# Patient Record
Sex: Female | Born: 1945 | Race: White | Hispanic: No | Marital: Married | Smoking: Former smoker
Health system: Southern US, Community
[De-identification: ages and names within clinical notes are randomized; demographics above are authoritative.]

## PROBLEM LIST (undated history)

## (undated) DIAGNOSIS — C801 Malignant (primary) neoplasm, unspecified: Secondary | ICD-10-CM

## (undated) DIAGNOSIS — H9190 Unspecified hearing loss, unspecified ear: Secondary | ICD-10-CM

## (undated) DIAGNOSIS — I509 Heart failure, unspecified: Secondary | ICD-10-CM

## (undated) DIAGNOSIS — I10 Essential (primary) hypertension: Secondary | ICD-10-CM

## (undated) HISTORY — DX: Essential (primary) hypertension: I10

## (undated) HISTORY — DX: Malignant (primary) neoplasm, unspecified: C80.1

## (undated) HISTORY — PX: BREAST SURGERY: SHX581

## (undated) HISTORY — DX: Unspecified hearing loss, unspecified ear: H91.90

---

## 1997-06-04 ENCOUNTER — Other Ambulatory Visit: Admission: RE | Admit: 1997-06-04 | Discharge: 1997-06-04 | Payer: Self-pay | Admitting: General Surgery

## 1997-06-11 ENCOUNTER — Ambulatory Visit (HOSPITAL_COMMUNITY): Admission: RE | Admit: 1997-06-11 | Discharge: 1997-06-11 | Payer: Self-pay | Admitting: General Surgery

## 1997-06-23 ENCOUNTER — Ambulatory Visit (HOSPITAL_COMMUNITY): Admission: RE | Admit: 1997-06-23 | Discharge: 1997-06-23 | Payer: Self-pay | Admitting: General Surgery

## 1997-09-30 ENCOUNTER — Encounter: Admission: RE | Admit: 1997-09-30 | Discharge: 1997-12-29 | Payer: Self-pay | Admitting: Radiation Oncology

## 1998-05-13 ENCOUNTER — Other Ambulatory Visit: Admission: RE | Admit: 1998-05-13 | Discharge: 1998-05-13 | Payer: Self-pay | Admitting: Obstetrics & Gynecology

## 1999-07-09 ENCOUNTER — Other Ambulatory Visit: Admission: RE | Admit: 1999-07-09 | Discharge: 1999-07-09 | Payer: Self-pay | Admitting: Obstetrics & Gynecology

## 2000-10-30 ENCOUNTER — Other Ambulatory Visit: Admission: RE | Admit: 2000-10-30 | Discharge: 2000-10-30 | Payer: Self-pay | Admitting: Obstetrics & Gynecology

## 2001-11-16 ENCOUNTER — Other Ambulatory Visit: Admission: RE | Admit: 2001-11-16 | Discharge: 2001-11-16 | Payer: Self-pay | Admitting: Obstetrics & Gynecology

## 2003-03-31 ENCOUNTER — Other Ambulatory Visit: Admission: RE | Admit: 2003-03-31 | Discharge: 2003-03-31 | Payer: Self-pay | Admitting: Obstetrics & Gynecology

## 2008-04-07 ENCOUNTER — Ambulatory Visit (HOSPITAL_COMMUNITY): Admission: RE | Admit: 2008-04-07 | Discharge: 2008-04-08 | Payer: Self-pay | Admitting: General Surgery

## 2008-04-07 ENCOUNTER — Encounter (INDEPENDENT_AMBULATORY_CARE_PROVIDER_SITE_OTHER): Payer: Self-pay | Admitting: General Surgery

## 2008-04-08 ENCOUNTER — Ambulatory Visit: Payer: Self-pay | Admitting: Oncology

## 2008-04-23 ENCOUNTER — Ambulatory Visit (HOSPITAL_COMMUNITY): Admission: RE | Admit: 2008-04-23 | Discharge: 2008-04-23 | Payer: Self-pay | Admitting: Oncology

## 2008-04-23 LAB — CBC WITH DIFFERENTIAL/PLATELET
Basophils Absolute: 0 10*3/uL (ref 0.0–0.1)
Eosinophils Absolute: 0.1 10*3/uL (ref 0.0–0.5)
HGB: 13.9 g/dL (ref 11.6–15.9)
NEUT#: 4 10*3/uL (ref 1.5–6.5)
RBC: 4.18 10*6/uL (ref 3.70–5.45)
RDW: 13.7 % (ref 11.2–14.5)
WBC: 6.6 10*3/uL (ref 3.9–10.3)
lymph#: 1.9 10*3/uL (ref 0.9–3.3)

## 2008-04-23 LAB — COMPREHENSIVE METABOLIC PANEL
ALT: 36 U/L — ABNORMAL HIGH (ref 0–35)
Albumin: 3.3 g/dL — ABNORMAL LOW (ref 3.5–5.2)
Alkaline Phosphatase: 72 U/L (ref 39–117)
CO2: 27 mEq/L (ref 19–32)
Glucose, Bld: 105 mg/dL — ABNORMAL HIGH (ref 70–99)
Potassium: 4 mEq/L (ref 3.5–5.3)
Sodium: 140 mEq/L (ref 135–145)
Total Protein: 6 g/dL (ref 6.0–8.3)

## 2008-04-23 LAB — CANCER ANTIGEN 27.29: CA 27.29: 12 U/mL (ref 0–39)

## 2008-05-21 ENCOUNTER — Ambulatory Visit (HOSPITAL_BASED_OUTPATIENT_CLINIC_OR_DEPARTMENT_OTHER): Admission: RE | Admit: 2008-05-21 | Discharge: 2008-05-21 | Payer: Self-pay | Admitting: General Surgery

## 2008-05-30 ENCOUNTER — Ambulatory Visit: Payer: Self-pay | Admitting: Oncology

## 2008-06-03 LAB — CBC WITH DIFFERENTIAL/PLATELET
Basophils Absolute: 0 10*3/uL (ref 0.0–0.1)
Eosinophils Absolute: 0.1 10*3/uL (ref 0.0–0.5)
HGB: 14.2 g/dL (ref 11.6–15.9)
LYMPH%: 26.1 % (ref 14.0–49.7)
MCV: 98.9 fL (ref 79.5–101.0)
MONO#: 0.6 10*3/uL (ref 0.1–0.9)
MONO%: 7.7 % (ref 0.0–14.0)
NEUT#: 4.9 10*3/uL (ref 1.5–6.5)
Platelets: 245 10*3/uL (ref 145–400)
RBC: 4.28 10*6/uL (ref 3.70–5.45)
WBC: 7.5 10*3/uL (ref 3.9–10.3)

## 2008-06-03 LAB — COMPREHENSIVE METABOLIC PANEL
Albumin: 3.8 g/dL (ref 3.5–5.2)
Alkaline Phosphatase: 78 U/L (ref 39–117)
BUN: 12 mg/dL (ref 6–23)
CO2: 30 mEq/L (ref 19–32)
Glucose, Bld: 106 mg/dL — ABNORMAL HIGH (ref 70–99)
Potassium: 4.2 mEq/L (ref 3.5–5.3)
Sodium: 141 mEq/L (ref 135–145)
Total Protein: 6.6 g/dL (ref 6.0–8.3)

## 2008-06-03 LAB — PROLACTIN: Prolactin: 12.2 ng/mL

## 2008-06-11 ENCOUNTER — Ambulatory Visit (HOSPITAL_COMMUNITY): Admission: RE | Admit: 2008-06-11 | Discharge: 2008-06-11 | Payer: Self-pay | Admitting: Oncology

## 2008-06-11 LAB — CBC WITH DIFFERENTIAL/PLATELET
BASO%: 0.5 % (ref 0.0–2.0)
Eosinophils Absolute: 0.1 10*3/uL (ref 0.0–0.5)
MCHC: 34 g/dL (ref 31.5–36.0)
MONO#: 0.7 10*3/uL (ref 0.1–0.9)
NEUT#: 7 10*3/uL — ABNORMAL HIGH (ref 1.5–6.5)
RBC: 4.26 10*6/uL (ref 3.70–5.45)
RDW: 13 % (ref 11.2–14.5)
WBC: 9.5 10*3/uL (ref 3.9–10.3)
lymph#: 1.8 10*3/uL (ref 0.9–3.3)

## 2008-06-11 LAB — COMPREHENSIVE METABOLIC PANEL
Albumin: 3.5 g/dL (ref 3.5–5.2)
BUN: 12 mg/dL (ref 6–23)
CO2: 30 mEq/L (ref 19–32)
Calcium: 9.1 mg/dL (ref 8.4–10.5)
Chloride: 101 mEq/L (ref 96–112)
Glucose, Bld: 104 mg/dL — ABNORMAL HIGH (ref 70–99)
Potassium: 3.9 mEq/L (ref 3.5–5.3)

## 2008-06-12 ENCOUNTER — Observation Stay (HOSPITAL_COMMUNITY): Admission: AD | Admit: 2008-06-12 | Discharge: 2008-06-13 | Payer: Self-pay | Admitting: Oncology

## 2008-06-12 ENCOUNTER — Ambulatory Visit: Payer: Self-pay | Admitting: Oncology

## 2008-06-18 LAB — CBC WITH DIFFERENTIAL/PLATELET
Basophils Absolute: 0 10*3/uL (ref 0.0–0.1)
EOS%: 1.6 % (ref 0.0–7.0)
Eosinophils Absolute: 0.1 10*3/uL (ref 0.0–0.5)
HGB: 13.3 g/dL (ref 11.6–15.9)
LYMPH%: 22.1 % (ref 14.0–49.7)
MCH: 33.5 pg (ref 25.1–34.0)
MCV: 99 fL (ref 79.5–101.0)
MONO%: 8 % (ref 0.0–14.0)
NEUT#: 4.1 10*3/uL (ref 1.5–6.5)
Platelets: 234 10*3/uL (ref 145–400)
RBC: 3.96 10*6/uL (ref 3.70–5.45)
RDW: 13.2 % (ref 11.2–14.5)

## 2008-06-18 LAB — COMPREHENSIVE METABOLIC PANEL
AST: 31 U/L (ref 0–37)
Alkaline Phosphatase: 59 U/L (ref 39–117)
BUN: 13 mg/dL (ref 6–23)
Glucose, Bld: 131 mg/dL — ABNORMAL HIGH (ref 70–99)
Total Bilirubin: 0.5 mg/dL (ref 0.3–1.2)

## 2008-07-02 LAB — CBC WITH DIFFERENTIAL/PLATELET
Basophils Absolute: 0 10*3/uL (ref 0.0–0.1)
Eosinophils Absolute: 0 10*3/uL (ref 0.0–0.5)
HCT: 38.8 % (ref 34.8–46.6)
HGB: 13 g/dL (ref 11.6–15.9)
LYMPH%: 36.2 % (ref 14.0–49.7)
MCH: 33 pg (ref 25.1–34.0)
MCV: 98.4 fL (ref 79.5–101.0)
MONO%: 12.5 % (ref 0.0–14.0)
NEUT#: 2.6 10*3/uL (ref 1.5–6.5)
NEUT%: 50.2 % (ref 38.4–76.8)
Platelets: 174 10*3/uL (ref 145–400)

## 2008-07-02 LAB — COMPREHENSIVE METABOLIC PANEL
AST: 24 U/L (ref 0–37)
Albumin: 3.5 g/dL (ref 3.5–5.2)
BUN: 12 mg/dL (ref 6–23)
CO2: 29 mEq/L (ref 19–32)
Calcium: 8.9 mg/dL (ref 8.4–10.5)
Chloride: 104 mEq/L (ref 96–112)
Creatinine, Ser: 0.5 mg/dL (ref 0.40–1.20)
Glucose, Bld: 108 mg/dL — ABNORMAL HIGH (ref 70–99)
Potassium: 3.7 mEq/L (ref 3.5–5.3)

## 2008-07-07 ENCOUNTER — Ambulatory Visit: Payer: Self-pay | Admitting: Oncology

## 2008-07-09 ENCOUNTER — Encounter: Admission: RE | Admit: 2008-07-09 | Discharge: 2008-08-21 | Payer: Self-pay | Admitting: Oncology

## 2008-07-09 LAB — CBC WITH DIFFERENTIAL/PLATELET
Basophils Absolute: 0 10*3/uL (ref 0.0–0.1)
Eosinophils Absolute: 0 10*3/uL (ref 0.0–0.5)
HCT: 38.4 % (ref 34.8–46.6)
HGB: 13.1 g/dL (ref 11.6–15.9)
MONO#: 0.4 10*3/uL (ref 0.1–0.9)
NEUT#: 2.6 10*3/uL (ref 1.5–6.5)
NEUT%: 51.1 % (ref 38.4–76.8)
RDW: 12.7 % (ref 11.2–14.5)
WBC: 5.2 10*3/uL (ref 3.9–10.3)
lymph#: 2.1 10*3/uL (ref 0.9–3.3)

## 2008-07-16 LAB — CBC WITH DIFFERENTIAL/PLATELET
BASO%: 0.3 % (ref 0.0–2.0)
EOS%: 0.5 % (ref 0.0–7.0)
LYMPH%: 52.4 % — ABNORMAL HIGH (ref 14.0–49.7)
MCH: 33.1 pg (ref 25.1–34.0)
MCHC: 34.1 g/dL (ref 31.5–36.0)
MCV: 97 fL (ref 79.5–101.0)
MONO%: 6.3 % (ref 0.0–14.0)
NEUT#: 1.5 10*3/uL (ref 1.5–6.5)
Platelets: 118 10*3/uL — ABNORMAL LOW (ref 145–400)
RBC: 4.04 10*6/uL (ref 3.70–5.45)
RDW: 13.4 % (ref 11.2–14.5)

## 2008-07-16 LAB — COMPREHENSIVE METABOLIC PANEL
AST: 26 U/L (ref 0–37)
Albumin: 3.5 g/dL (ref 3.5–5.2)
Alkaline Phosphatase: 57 U/L (ref 39–117)
Potassium: 4 mEq/L (ref 3.5–5.3)
Sodium: 139 mEq/L (ref 135–145)
Total Bilirubin: 0.9 mg/dL (ref 0.3–1.2)
Total Protein: 6.1 g/dL (ref 6.0–8.3)

## 2008-07-30 LAB — CBC WITH DIFFERENTIAL/PLATELET
Basophils Absolute: 0 10*3/uL (ref 0.0–0.1)
EOS%: 0.1 % (ref 0.0–7.0)
Eosinophils Absolute: 0 10*3/uL (ref 0.0–0.5)
LYMPH%: 42.8 % (ref 14.0–49.7)
MCH: 33.9 pg (ref 25.1–34.0)
MCV: 97.7 fL (ref 79.5–101.0)
MONO%: 10 % (ref 0.0–14.0)
NEUT#: 1.8 10*3/uL (ref 1.5–6.5)
Platelets: 171 10*3/uL (ref 145–400)
RBC: 3.59 10*6/uL — ABNORMAL LOW (ref 3.70–5.45)
RDW: 15 % — ABNORMAL HIGH (ref 11.2–14.5)

## 2008-07-30 LAB — COMPREHENSIVE METABOLIC PANEL
AST: 27 U/L (ref 0–37)
Albumin: 3.7 g/dL (ref 3.5–5.2)
Alkaline Phosphatase: 54 U/L (ref 39–117)
BUN: 9 mg/dL (ref 6–23)
Glucose, Bld: 115 mg/dL — ABNORMAL HIGH (ref 70–99)
Potassium: 3.7 mEq/L (ref 3.5–5.3)
Sodium: 139 mEq/L (ref 135–145)
Total Bilirubin: 0.8 mg/dL (ref 0.3–1.2)

## 2008-08-06 LAB — CBC WITH DIFFERENTIAL/PLATELET
Basophils Absolute: 0 10*3/uL (ref 0.0–0.1)
Eosinophils Absolute: 0 10*3/uL (ref 0.0–0.5)
HCT: 35.3 % (ref 34.8–46.6)
HGB: 12.1 g/dL (ref 11.6–15.9)
MONO#: 0.5 10*3/uL (ref 0.1–0.9)
NEUT#: 3.5 10*3/uL (ref 1.5–6.5)
NEUT%: 56.1 % (ref 38.4–76.8)
RDW: 15.5 % — ABNORMAL HIGH (ref 11.2–14.5)
WBC: 6.3 10*3/uL (ref 3.9–10.3)
lymph#: 2.2 10*3/uL (ref 0.9–3.3)

## 2008-08-12 ENCOUNTER — Ambulatory Visit: Payer: Self-pay | Admitting: Oncology

## 2008-08-13 LAB — CBC WITH DIFFERENTIAL/PLATELET
Basophils Absolute: 0 10*3/uL (ref 0.0–0.1)
EOS%: 0.2 % (ref 0.0–7.0)
Eosinophils Absolute: 0 10*3/uL (ref 0.0–0.5)
HCT: 33 % — ABNORMAL LOW (ref 34.8–46.6)
HGB: 11.4 g/dL — ABNORMAL LOW (ref 11.6–15.9)
MCH: 34.4 pg — ABNORMAL HIGH (ref 25.1–34.0)
MCV: 99.4 fL (ref 79.5–101.0)
MONO%: 9.4 % (ref 0.0–14.0)
NEUT#: 2.5 10*3/uL (ref 1.5–6.5)
NEUT%: 50.7 % (ref 38.4–76.8)
RDW: 16.4 % — ABNORMAL HIGH (ref 11.2–14.5)

## 2008-08-27 LAB — COMPREHENSIVE METABOLIC PANEL
ALT: 18 U/L (ref 0–35)
Albumin: 3.5 g/dL (ref 3.5–5.2)
Alkaline Phosphatase: 54 U/L (ref 39–117)
Glucose, Bld: 112 mg/dL — ABNORMAL HIGH (ref 70–99)
Potassium: 3.8 mEq/L (ref 3.5–5.3)
Sodium: 137 mEq/L (ref 135–145)
Total Bilirubin: 0.8 mg/dL (ref 0.3–1.2)
Total Protein: 5.7 g/dL — ABNORMAL LOW (ref 6.0–8.3)

## 2008-08-27 LAB — CBC WITH DIFFERENTIAL/PLATELET
Basophils Absolute: 0 10*3/uL (ref 0.0–0.1)
EOS%: 0.2 % (ref 0.0–7.0)
Eosinophils Absolute: 0 10*3/uL (ref 0.0–0.5)
HGB: 10.8 g/dL — ABNORMAL LOW (ref 11.6–15.9)
MCH: 33.5 pg (ref 25.1–34.0)
NEUT#: 3.3 10*3/uL (ref 1.5–6.5)
RBC: 3.22 10*6/uL — ABNORMAL LOW (ref 3.70–5.45)
RDW: 18.7 % — ABNORMAL HIGH (ref 11.2–14.5)
lymph#: 1.9 10*3/uL (ref 0.9–3.3)
nRBC: 0 % (ref 0–0)

## 2008-09-03 LAB — CBC WITH DIFFERENTIAL/PLATELET
Eosinophils Absolute: 0 10*3/uL (ref 0.0–0.5)
HGB: 10.4 g/dL — ABNORMAL LOW (ref 11.6–15.9)
MCV: 100 fL (ref 79.5–101.0)
MONO%: 5.7 % (ref 0.0–14.0)
NEUT#: 2.3 10*3/uL (ref 1.5–6.5)
RBC: 3.06 10*6/uL — ABNORMAL LOW (ref 3.70–5.45)
RDW: 18.6 % — ABNORMAL HIGH (ref 11.2–14.5)
WBC: 4.4 10*3/uL (ref 3.9–10.3)
lymph#: 1.8 10*3/uL (ref 0.9–3.3)
nRBC: 0 % (ref 0–0)

## 2008-09-05 ENCOUNTER — Ambulatory Visit: Payer: Self-pay | Admitting: Oncology

## 2008-09-10 LAB — CBC WITH DIFFERENTIAL/PLATELET
Basophils Absolute: 0 10*3/uL (ref 0.0–0.1)
EOS%: 0.3 % (ref 0.0–7.0)
HCT: 29.3 % — ABNORMAL LOW (ref 34.8–46.6)
LYMPH%: 41.8 % (ref 14.0–49.7)
MCH: 37.2 pg — ABNORMAL HIGH (ref 25.1–34.0)
MONO%: 9.7 % (ref 0.0–14.0)
NEUT#: 1.8 10*3/uL (ref 1.5–6.5)
RDW: 23.1 % — ABNORMAL HIGH (ref 11.2–14.5)
WBC: 3.7 10*3/uL — ABNORMAL LOW (ref 3.9–10.3)

## 2008-09-25 LAB — CBC WITH DIFFERENTIAL/PLATELET
Basophils Absolute: 0 10*3/uL (ref 0.0–0.1)
EOS%: 0.2 % (ref 0.0–7.0)
Eosinophils Absolute: 0 10*3/uL (ref 0.0–0.5)
HCT: 29.3 % — ABNORMAL LOW (ref 34.8–46.6)
HGB: 10 g/dL — ABNORMAL LOW (ref 11.6–15.9)
MCH: 38.4 pg — ABNORMAL HIGH (ref 25.1–34.0)
MONO#: 0.5 10*3/uL (ref 0.1–0.9)
NEUT#: 1.1 10*3/uL — ABNORMAL LOW (ref 1.5–6.5)
NEUT%: 32.5 % — ABNORMAL LOW (ref 38.4–76.8)
RDW: 22 % — ABNORMAL HIGH (ref 11.2–14.5)
lymph#: 1.8 10*3/uL (ref 0.9–3.3)

## 2008-09-25 LAB — COMPREHENSIVE METABOLIC PANEL
CO2: 26 mEq/L (ref 19–32)
Calcium: 9.1 mg/dL (ref 8.4–10.5)
Creatinine, Ser: 0.63 mg/dL (ref 0.40–1.20)
Glucose, Bld: 87 mg/dL (ref 70–99)
Total Bilirubin: 0.5 mg/dL (ref 0.3–1.2)

## 2008-09-26 LAB — CANCER ANTIGEN 27.29: CA 27.29: 26 U/mL (ref 0–39)

## 2008-09-26 LAB — VITAMIN D 25 HYDROXY (VIT D DEFICIENCY, FRACTURES): Vit D, 25-Hydroxy: 40 ng/mL (ref 30–89)

## 2008-10-11 ENCOUNTER — Other Ambulatory Visit: Payer: Self-pay | Admitting: Emergency Medicine

## 2008-10-11 ENCOUNTER — Inpatient Hospital Stay (HOSPITAL_COMMUNITY): Admission: EM | Admit: 2008-10-11 | Discharge: 2008-10-13 | Payer: Self-pay | Admitting: Psychiatry

## 2008-10-11 ENCOUNTER — Ambulatory Visit: Payer: Self-pay | Admitting: Psychiatry

## 2008-10-15 ENCOUNTER — Other Ambulatory Visit (HOSPITAL_COMMUNITY): Admission: RE | Admit: 2008-10-15 | Discharge: 2008-11-19 | Payer: Self-pay | Admitting: Psychiatry

## 2008-10-16 ENCOUNTER — Ambulatory Visit: Payer: Self-pay | Admitting: Psychiatry

## 2008-10-24 ENCOUNTER — Ambulatory Visit: Payer: Self-pay | Admitting: Oncology

## 2008-10-28 LAB — CBC WITH DIFFERENTIAL/PLATELET
Basophils Absolute: 0 10*3/uL (ref 0.0–0.1)
EOS%: 0.2 % (ref 0.0–7.0)
Eosinophils Absolute: 0 10*3/uL (ref 0.0–0.5)
HCT: 38.4 % (ref 34.8–46.6)
HGB: 13.1 g/dL (ref 11.6–15.9)
MCH: 36.7 pg — ABNORMAL HIGH (ref 25.1–34.0)
MCV: 107.6 fL — ABNORMAL HIGH (ref 79.5–101.0)
MONO%: 6.5 % (ref 0.0–14.0)
NEUT#: 4 10*3/uL (ref 1.5–6.5)
NEUT%: 67.5 % (ref 38.4–76.8)
lymph#: 1.5 10*3/uL (ref 0.9–3.3)

## 2008-10-28 LAB — COMPREHENSIVE METABOLIC PANEL
AST: 24 U/L (ref 0–37)
Albumin: 3.8 g/dL (ref 3.5–5.2)
BUN: 7 mg/dL (ref 6–23)
Calcium: 9.8 mg/dL (ref 8.4–10.5)
Chloride: 105 mEq/L (ref 96–112)
Creatinine, Ser: 0.71 mg/dL (ref 0.40–1.20)
Glucose, Bld: 108 mg/dL — ABNORMAL HIGH (ref 70–99)

## 2008-12-16 ENCOUNTER — Ambulatory Visit (HOSPITAL_BASED_OUTPATIENT_CLINIC_OR_DEPARTMENT_OTHER): Admission: RE | Admit: 2008-12-16 | Discharge: 2008-12-16 | Payer: Self-pay | Admitting: General Surgery

## 2008-12-18 ENCOUNTER — Ambulatory Visit: Payer: Self-pay | Admitting: Oncology

## 2008-12-22 LAB — CBC WITH DIFFERENTIAL/PLATELET
Basophils Absolute: 0 10*3/uL (ref 0.0–0.1)
EOS%: 0.2 % (ref 0.0–7.0)
HCT: 38.8 % (ref 34.8–46.6)
HGB: 13.1 g/dL (ref 11.6–15.9)
LYMPH%: 31.7 % (ref 14.0–49.7)
MCH: 34.8 pg — ABNORMAL HIGH (ref 25.1–34.0)
MCV: 103.3 fL — ABNORMAL HIGH (ref 79.5–101.0)
MONO%: 7.4 % (ref 0.0–14.0)
NEUT%: 60.3 % (ref 38.4–76.8)
Platelets: 211 10*3/uL (ref 145–400)
RDW: 14.1 % (ref 11.2–14.5)

## 2008-12-22 LAB — COMPREHENSIVE METABOLIC PANEL
AST: 17 U/L (ref 0–37)
Alkaline Phosphatase: 65 U/L (ref 39–117)
BUN: 15 mg/dL (ref 6–23)
Creatinine, Ser: 0.64 mg/dL (ref 0.40–1.20)
Total Bilirubin: 0.8 mg/dL (ref 0.3–1.2)

## 2008-12-30 ENCOUNTER — Ambulatory Visit: Admission: RE | Admit: 2008-12-30 | Discharge: 2009-02-13 | Payer: Self-pay | Admitting: Radiation Oncology

## 2009-02-16 ENCOUNTER — Ambulatory Visit: Admission: RE | Admit: 2009-02-16 | Discharge: 2009-03-06 | Payer: Self-pay | Admitting: Radiation Oncology

## 2009-03-19 ENCOUNTER — Ambulatory Visit: Admission: RE | Admit: 2009-03-19 | Discharge: 2009-03-19 | Payer: Self-pay | Admitting: Radiation Oncology

## 2009-03-30 ENCOUNTER — Ambulatory Visit: Payer: Self-pay | Admitting: Oncology

## 2009-04-01 LAB — CBC WITH DIFFERENTIAL/PLATELET
Eosinophils Absolute: 0.1 10*3/uL (ref 0.0–0.5)
HCT: 41.2 % (ref 34.8–46.6)
LYMPH%: 27.3 % (ref 14.0–49.7)
MCHC: 33.9 g/dL (ref 31.5–36.0)
MCV: 96.2 fL (ref 79.5–101.0)
MONO%: 7.2 % (ref 0.0–14.0)
NEUT%: 62.2 % (ref 38.4–76.8)
Platelets: 179 10*3/uL (ref 145–400)
RBC: 4.28 10*6/uL (ref 3.70–5.45)

## 2009-04-01 LAB — COMPREHENSIVE METABOLIC PANEL
Alkaline Phosphatase: 62 U/L (ref 39–117)
Creatinine, Ser: 0.7 mg/dL (ref 0.40–1.20)
Glucose, Bld: 82 mg/dL (ref 70–99)
Sodium: 141 mEq/L (ref 135–145)
Total Bilirubin: 0.5 mg/dL (ref 0.3–1.2)
Total Protein: 6.5 g/dL (ref 6.0–8.3)

## 2009-04-01 LAB — CANCER ANTIGEN 27.29: CA 27.29: 16 U/mL (ref 0–39)

## 2009-09-29 ENCOUNTER — Ambulatory Visit: Payer: Self-pay | Admitting: Oncology

## 2009-09-29 LAB — COMPREHENSIVE METABOLIC PANEL
Albumin: 4 g/dL (ref 3.5–5.2)
CO2: 27 mEq/L (ref 19–32)
Glucose, Bld: 103 mg/dL — ABNORMAL HIGH (ref 70–99)
Sodium: 142 mEq/L (ref 135–145)
Total Bilirubin: 0.3 mg/dL (ref 0.3–1.2)
Total Protein: 6.3 g/dL (ref 6.0–8.3)

## 2009-09-29 LAB — CBC WITH DIFFERENTIAL/PLATELET
Eosinophils Absolute: 0.1 10*3/uL (ref 0.0–0.5)
HCT: 38 % (ref 34.8–46.6)
LYMPH%: 18.9 % (ref 14.0–49.7)
MONO#: 0.5 10*3/uL (ref 0.1–0.9)
NEUT#: 4.6 10*3/uL (ref 1.5–6.5)
Platelets: 238 10*3/uL (ref 145–400)
RBC: 4.07 10*6/uL (ref 3.70–5.45)
WBC: 6.5 10*3/uL (ref 3.9–10.3)

## 2009-09-29 LAB — CANCER ANTIGEN 27.29: CA 27.29: 22 U/mL (ref 0–39)

## 2010-03-07 ENCOUNTER — Encounter: Payer: Self-pay | Admitting: Oncology

## 2010-04-06 ENCOUNTER — Other Ambulatory Visit: Payer: Self-pay | Admitting: Oncology

## 2010-04-06 ENCOUNTER — Encounter (HOSPITAL_BASED_OUTPATIENT_CLINIC_OR_DEPARTMENT_OTHER): Payer: Medicare Other | Admitting: Oncology

## 2010-04-06 DIAGNOSIS — C50419 Malignant neoplasm of upper-outer quadrant of unspecified female breast: Secondary | ICD-10-CM

## 2010-04-06 LAB — COMPREHENSIVE METABOLIC PANEL
ALT: 17 U/L (ref 0–35)
AST: 18 U/L (ref 0–37)
Albumin: 4.4 g/dL (ref 3.5–5.2)
Calcium: 9.8 mg/dL (ref 8.4–10.5)
Chloride: 105 mEq/L (ref 96–112)
Potassium: 4 mEq/L (ref 3.5–5.3)
Sodium: 143 mEq/L (ref 135–145)

## 2010-04-06 LAB — CBC WITH DIFFERENTIAL/PLATELET
BASO%: 0.7 % (ref 0.0–2.0)
HCT: 41 % (ref 34.8–46.6)
MCHC: 34.4 g/dL (ref 31.5–36.0)
MONO#: 0.3 10*3/uL (ref 0.1–0.9)
RBC: 4.31 10*6/uL (ref 3.70–5.45)
RDW: 13.9 % (ref 11.2–14.5)
WBC: 5.6 10*3/uL (ref 3.9–10.3)
lymph#: 1.4 10*3/uL (ref 0.9–3.3)
nRBC: 0 % (ref 0–0)

## 2010-04-13 ENCOUNTER — Encounter (HOSPITAL_BASED_OUTPATIENT_CLINIC_OR_DEPARTMENT_OTHER): Payer: Medicare Other | Admitting: Oncology

## 2010-04-13 DIAGNOSIS — C50419 Malignant neoplasm of upper-outer quadrant of unspecified female breast: Secondary | ICD-10-CM

## 2010-05-21 LAB — URINE DRUGS OF ABUSE SCREEN W ALC, ROUTINE (REF LAB)
Benzodiazepines.: POSITIVE — AB
Cocaine Metabolites: NEGATIVE
Ethyl Alcohol: <10 mg/dL (ref <10)
Marijuana Metabolite: NEGATIVE
Opiate Screen, Urine: NEGATIVE
Phencyclidine (PCP): NEGATIVE
Propoxyphene: NEGATIVE

## 2010-05-21 LAB — BENZODIAZEPINE, QUANTITATIVE, URINE
Alprazolam (GC/LC/MS), ur confirm: NEGATIVE
Flurazepam GC/MS Conf: NEGATIVE
Nordiazepam GC/MS Conf: 160 ng/mL
Oxazepam GC/MS Conf: 1700 ng/mL

## 2010-05-22 LAB — ETHANOL: Alcohol, Ethyl (B): 13 mg/dL — ABNORMAL HIGH (ref 0–10)

## 2010-05-22 LAB — COMPREHENSIVE METABOLIC PANEL
ALT: 39 U/L — ABNORMAL HIGH (ref 0–35)
AST: 47 U/L — ABNORMAL HIGH (ref 0–37)
Albumin: 3.9 g/dL (ref 3.5–5.2)
CO2: 25 mEq/L (ref 19–32)
Chloride: 104 mEq/L (ref 96–112)
Creatinine, Ser: 0.54 mg/dL (ref 0.4–1.2)
GFR calc Af Amer: >60 mL/min (ref >60)
GFR calc non Af Amer: >60 mL/min (ref >60)
Potassium: 3.6 mEq/L (ref 3.5–5.1)
Sodium: 140 mEq/L (ref 135–145)
Total Bilirubin: 0.7 mg/dL (ref 0.3–1.2)

## 2010-05-22 LAB — RAPID URINE DRUG SCREEN, HOSP PERFORMED: Barbiturates: NOT DETECTED

## 2010-05-26 LAB — URINE MICROSCOPIC-ADD ON

## 2010-05-26 LAB — COMPREHENSIVE METABOLIC PANEL
Albumin: 3.5 g/dL (ref 3.5–5.2)
BUN: 13 mg/dL (ref 6–23)
Calcium: 9.3 mg/dL (ref 8.4–10.5)
Glucose, Bld: 97 mg/dL (ref 70–99)
Potassium: 4.5 mEq/L (ref 3.5–5.1)
Sodium: 138 mEq/L (ref 135–145)
Total Protein: 6.3 g/dL (ref 6.0–8.3)

## 2010-05-26 LAB — URINALYSIS, ROUTINE W REFLEX MICROSCOPIC
Bilirubin Urine: NEGATIVE
Glucose, UA: NEGATIVE mg/dL
Hgb urine dipstick: NEGATIVE
Specific Gravity, Urine: 1.019 (ref 1.005–1.030)
pH: 7 (ref 5.0–8.0)

## 2010-05-26 LAB — DIFFERENTIAL
Lymphs Abs: 2.2 10*3/uL (ref 0.7–4.0)
Monocytes Absolute: 0.7 10*3/uL (ref 0.1–1.0)
Monocytes Relative: 9 % (ref 3–12)
Neutro Abs: 4.9 10*3/uL (ref 1.7–7.7)
Neutrophils Relative %: 62 % (ref 43–77)

## 2010-05-26 LAB — CBC
Hemoglobin: 14.9 g/dL (ref 12.0–15.0)
MCHC: 34.2 g/dL (ref 30.0–36.0)
Platelets: 234 10*3/uL (ref 150–400)
RDW: 13.6 % (ref 11.5–15.5)

## 2010-06-01 LAB — URINALYSIS, ROUTINE W REFLEX MICROSCOPIC
Hgb urine dipstick: NEGATIVE
Protein, ur: NEGATIVE mg/dL
Specific Gravity, Urine: 1.023 (ref 1.005–1.030)
Urobilinogen, UA: 0.2 mg/dL (ref 0.0–1.0)

## 2010-06-01 LAB — DIFFERENTIAL
Basophils Relative: 1 % (ref 0–1)
Lymphs Abs: 2.2 10*3/uL (ref 0.7–4.0)
Monocytes Relative: 8 % (ref 3–12)
Neutro Abs: 4.5 10*3/uL (ref 1.7–7.7)
Neutrophils Relative %: 61 % (ref 43–77)

## 2010-06-01 LAB — COMPREHENSIVE METABOLIC PANEL
ALT: 49 U/L — ABNORMAL HIGH (ref 0–35)
AST: 42 U/L — ABNORMAL HIGH (ref 0–37)
Albumin: 3.8 g/dL (ref 3.5–5.2)
Chloride: 101 mEq/L (ref 96–112)
Creatinine, Ser: 0.68 mg/dL (ref 0.4–1.2)
GFR calc Af Amer: >60 mL/min (ref >60)
GFR calc non Af Amer: >60 mL/min (ref >60)
Potassium: 4.2 mEq/L (ref 3.5–5.1)
Sodium: 139 mEq/L (ref 135–145)
Total Bilirubin: 0.7 mg/dL (ref 0.3–1.2)

## 2010-06-01 LAB — CBC
MCV: 97.8 fL (ref 78.0–100.0)
Platelets: 248 10*3/uL (ref 150–400)
WBC: 7.4 10*3/uL (ref 4.0–10.5)

## 2010-06-01 LAB — URINE MICROSCOPIC-ADD ON

## 2010-06-29 NOTE — H&P (Signed)
NAMEBETTYJANE, SHENOY                ACCOUNT NO.:  [id number]   MEDICAL RECORD NO.:  [id number]          PATIENT TYPE:  OBV   LOCATION:  1314                         FACILITY:  [facility]   PHYSICIAN:  Valentino Hue. Magrinat, M.D.DATE OF BIRTH:  11/04/1945   DATE OF ADMISSION:  06/12/2008  DATE OF DISCHARGE:                              HISTORY & PHYSICAL   Wanda Dorsey is a 65 year old woman with a history of recurrent  breast cancer, admitted for relief of obstipation and for chemotherapy.   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  Lymph nodes were not involved.  She received Cytoxan and Adriamycin x4  followed by radiation.  The patient refused tamoxifen, did take Evista  for approximately 1 year.   More recently screening mammography February 2010, showed a suspicious  mass in the right upper quadrant measuring up to 3.1 cm.  By ultrasound,  this was clearly abnormal and associated with an area of palpable  thickening.  Biopsy was performed the same day and showed an invasive  lobular carcinoma which was again ER positive at 51%, PR negative at 0%  with an MIB-1 of 36% and HER2 negative with a CISH ratio of 1.03.   The patient saw Dr. Johna Sheriff and since she had, had prior radiation to  that breast, mastectomy was the only option.  This was performed  April 07, 2008.  The final pathology showing a 3.5 cm invasive  lobular carcinoma, grade 3, no lymphovascular invasion, and 0-13 lymph  nodes involved.  The patient had a positive deep margin which was  extensively discussed.  This is at the level of muscle and it was  fulgurated.  No further radiation or surgery is planned.   The patient was then evaluated for chemotherapy and in light of her  prior treatment, she is being treated with carboplatin and Taxotere  given 3 weeks on, 1 week off, and we will plan to do this for 4 months.  The  patient had her first treatment last week and was scheduled to  receive treatment the day of admission on June 12, 2008, but for the  past several days the patient had been unable to have a bowel movement.  We had discussed multiple interventions over the phone and the patient  had stool softeners, MiraLax, Milk of Magnesia, suppositories, Fleet's  enemas with absolutely no relief.  Accordingly, the patient was admitted  for relief of obstipation and to proceed with chemotherapy.   PAST MEDICAL HISTORY:  1. Prolactinoma.  2. Seasonal allergies.  3. Hypertension.  4. Status post tonsillectomy and adenoidectomy.  5. History of migraines.  6. History of left eye cataract.   FAMILY HISTORY:  The patient's parents are still alive.  There is no  history of breast or ovarian cancer in the immediate family.   GYNECOLOGIC HISTORY:  She is GX, P2.  P1 was at age 2.  She took  estrogen between 1988 and 1992 or so (according to the patient).  SOCIAL HISTORY:  B. J. is a Clinical research associate in town as is her husband Conservation officer, nature.  They  have two sons, Will 6 and George 21, both at home.   ALLERGIES:  AMPICILLIN CAUSES HIVES.   MEDICATIONS:  1. Lisinopril 40 mg twice daily.  2. Reglan 10 mg four times a day as needed for nausea.  3. Of course as noted above, she has been taking a variety of over-the-      counter medications and interventions for obstipation.   REVIEW OF SYSTEMS:  Aside from the problems with severe constipation,  the patient has tolerated chemotherapy well.  There has been no  significant nausea or vomiting.  No unusual headaches, no visual  changes, no mouth sores, no cough, phlegm production or pleurisy.  No  problems with her urine.  No aches and pains.  A detailed review of  systems was otherwise noncontributory.   PHYSICAL EXAMINATION:  VITAL SIGNS:  The patient's temperature was 98.4,  pulse 106, respiratory rate 24 and blood pressure 136/93.  Room air  saturation was 98%.  HEENT:   Oropharynx was clear.  No peripheral adenopathy.  LUNGS:  No crackles or wheezes.  HEART:  Regular rate and rhythm.  BREASTS:  Deferred.  ABDOMEN:  Soft.  Bowel sounds were slightly increased.  There was no  __________, no tenderness to palpation.  No masses were palpated.  MUSCULOSKELETAL:  Showed no peripheral edema.  NEUROLOGIC:  Nonfocal.   FILMS:  We obtained a KUB which showed a nonobstructive pattern.   IMPRESSION AND PLAN:  A 65 year old [place] woman with a history of  recurrent lobular breast cancer, currently due for the second dose of  the first cycle of weekly carboplatin/Taxotere, admitted with severe  obstipation   We will move aggressively to relieve the obstipation and if we are  successful, we will proceed to chemotherapy prior to discharge.  The  patient's subsequent chemotherapy treatments may have to be adjusted as  a result of this.      Valentino Hue. Magrinat, M.D.  Electronically Signed     GCM/MEDQ  D:  06/13/2008  T:  06/13/2008  Job:  [id number]   cc:   Freddy Finner, M.D.  Fax: [phone]   Lorne Skeens. Hoxworth, M.D.  [address]  [place]  [place] [address]   Maryln Gottron, M.D.  Fax: [phone]   Maryelizabeth Rowan, M.D.  Fax: [phone]   Deanna Artis. Sharene Skeans, M.D.  Fax: [phone]

## 2010-06-29 NOTE — Op Note (Signed)
Wanda Dorsey, TUSING                ACCOUNT NO.:  [id number]   MEDICAL RECORD NO.:  [id number]          PATIENT TYPE:  AMB   LOCATION:  DSC                          FACILITY:  [facility]   PHYSICIAN:  Sharlet Salina T. Hoxworth, M.D.DATE OF BIRTH:  12/27/1945   DATE OF PROCEDURE:  05/21/2008  DATE OF DISCHARGE:                               OPERATIVE REPORT   PREOPERATIVE DIAGNOSES:  Cancer of the breast and poor venous access.   POSTOPERATIVE DIAGNOSES:  Cancer of the breast and poor venous access.   SURGICAL PROCEDURE:  Placement of Bard export Port-A-Cath via left  subclavian vein.   SURGEON:  Lorne Skeens. Hoxworth, MD   ANESTHESIA:  Local IV sedation.   BRIEF HISTORY:  [name] is a 65 year old female with a recent  diagnosis of breast cancer who require long-term access for IV  chemotherapy.  Placement of subcutaneous venous port has been recommend  and accepted.  The nature of the procedure, its indications, and risks  of bleeding, infection, pneumothorax, catheter displacement, thrombosis,  and infection have been discussed and understood.  She was now brought  to the operating room for this procedure.   DESCRIPTION OF OPERATION:  The patient was brought to the operating  room, placed in supine position on the operating table, and IV sedation  was administered.  She was positioned with arms tucked and roll between  her shoulder blades; and the left neck, chest, shoulder, and upper arm  were widely, sterilely prepped and draped.  Correct patient and  procedure were verified.  The left subclavian area was anesthetized and  then the left subclavian vein cannulated with a needle and guidewire  without difficulty confirmed by fluoroscopy.  The introducer was placed  over the guidewire and then the flushed catheter positioned via the  introducer, which was stripped away and the tip of the catheter  positioned at the cavoatrial junction.  A site on the anterior chest  wall was then  anesthetized.  A small transverse incision was made and  subcutaneous pocket created.  The catheter was tunneled subcutaneously  to the pocket, trimmed to length and attached to the port, which was  placed in the pocket sutured with 2 interrupted Prolene sutures.  Fluoroscopy again showed good position of the catheter.  The incisions  were then closed with subcutaneous 4-0 Monocryl and subcuticular 4-0  Monocryl and Dermabond.  The catheter flushed with concentrated heparin  solution.  The patient was taken to recovery room in good condition.  Sponge, needle, and instrument counts were correct.      Lorne Skeens. Hoxworth, M.D.  Electronically Signed     BTH/MEDQ  D:  05/21/2008  T:  05/21/2008  Job:  [id number]

## 2010-06-29 NOTE — H&P (Signed)
Wanda Dorsey, Wanda Dorsey NO.:  [id number]   MEDICAL RECORD NO.:  [id number]          PATIENT TYPE:  IPS   LOCATION:  0302                          FACILITY:  [facility]   PHYSICIAN:  Geoffery Lyons, M.D.      DATE OF BIRTH:  1945-02-27   DATE OF ADMISSION:  10/11/2008  DATE OF DISCHARGE:                       PSYCHIATRIC ADMISSION ASSESSMENT   IDENTIFYING INFORMATION/JUSTIFICATION FOR ADMISSION AND CARE:  This is a  65 year old married white female.  She presented to the emergency  department at [facility] requesting help with detoxing from  alcohol.  Apparently, her breast carcinoma recurred last February, she  took 63 out of 12 rounds of chemo, ending around July 29th, and since  then has resumed her prior alcohol intake of Vodka approximately a half  pint a day.  She states that her 38 year old son is a cause of much  anxiety for her, he is currently enrolled in vocational rehab.  He has  not been able to live independently, as of yet, although there is hope  and she and her husband are doing what they can via a trust to help  ensure that financially he is provided for in the future.  She does that  she needs to give it up, as she says, however this has been exceedingly  difficult in light of her breast cancer recurrence.   PAST PSYCHIATRIC HISTORY:  She does not really have any.   SOCIAL HISTORY:  As already stated, she is married.  She is a retired  Counsellor and she does have a 55 year old son who developed some  degree of coping issues when he was in high school, he is currently  under the care of Dr. Dr. Meredith Staggers.   FAMILY HISTORY:  Negative.   ALCOHOL AND DRUG HISTORY:  She reports drinking recently a half pint of  Vodka a day.  She has drunk socially in the past, but she has never had  any impairments or consequences from it.   MEDICAL PROBLEMS:  1. She has hypertension for which she is treated.  2. A history for migraines.  3. A history for  a left eye cataract.  4. She is status post T and A.  5. Seasonal allergies.  6. A prolactinoma.  7. She was admitted to back in April with recurrent lobular breast      cancer and she was getting her second dose of chemotherapy at that      time.   CURRENTLY PRESCRIBED MEDICATIONS:  1. Lisinopril 20 mg p.o. daily.  2. Reglan 10 mg before meals and nightly.  3. Prilosec over-the-counter 40 mg b.i.d.   DRUG ALLERGIES:  1. PENICILLIN.  2. AMPICILLIN.   POSITIVE PHYSICAL FINDINGS:  Her most remarkable physical finding is she  is status post a right mastectomy.  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  VITAL SIGNS ON ADMISSION:  Show that her temperature was 97.3, pulse was  95, respirations 18, blood pressure 130/65, her blood alcohol level was  13.  She had no other remarkable lab findings and she denies any signs  or  symptoms of withdrawal.   MENTAL STATUS EXAM:  She was seen in her room.  She was appropriately  groomed, dressed and nourished.  She was wearing her own bedclothes.  Her motor is intact.  Her speech is of normal rate, rhythm and tone.  Her mood is appropriate to the situation.  Thought processes are clear,  rational, goal oriented.  She is willing to do whatever it takes to not  resume drinking.  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  Judgment and insight are good.  Concentration and  memory are intact.  Intelligence is at least average.  She is not  suicidal or homicidal.  She does not have any auditory visual  hallucinations.   DIAGNOSES:  AXIS I:  Alcohol dependence.  AXIS II:  Deferred.  AXIS III:  History for recurrent breast carcinoma, right, in February.  AXIS IV:  Mild to moderate.  AXIS V:  45.   PLAN:  To help support her from withdrawal through use of the low-dose  Librium protocol.  She was requesting information about medications to  help her not crave.  She realizes she does not want Antabuse but she   will consider naltrexone or Campral.  Dr. Dub Mikes will make that decision.   ESTIMATED LENGTH OF STAY:  3-4 days.      Mickie Leonarda Salon, P.A.-C.      Geoffery Lyons, M.D.  Electronically Signed    MD/MEDQ  D:  10/12/2008  T:  10/12/2008  Job:  [id number]

## 2010-06-29 NOTE — Discharge Summary (Signed)
Wanda Dorsey, Wanda Dorsey                ACCOUNT NO.:  [id number]   MEDICAL RECORD NO.:  [id number]          PATIENT TYPE:  OBV   LOCATION:  1314                         FACILITY:  [facility]   PHYSICIAN:  Valentino Hue. Magrinat, M.D.DATE OF BIRTH:  08-15-1945   DATE OF ADMISSION:  06/12/2008  DATE OF DISCHARGE:                               DISCHARGE SUMMARY   DISCHARGE DIAGNOSES:  1. Obstipation.  2. Recurrent lobular breast cancer.  3. History of prolactinoma.  4. Seasonal allergies.  5. Hypotension.  6. Status post tonsillectomy and adenoidectomy.  7. History of migraines.  8. History of left eye cataract.   PROCEDURES:  Intravenous chemotherapy.   HOSPITAL COURSE:  The patient was admitted with severe obstipation.  KUB  did not show any evidence of obstruction.  She received a milk and  molasses enema with initial results and then NuLytely 400 mL with  excellent results.  She had multiple stools from the time of admission  through 3:00 a.m., the last ones all liquid.  She feels much relieved on  the morning of this dictation.   The patient's chemotherapy was to have been given yesterday.  It was  held because of this problem.  Now that it has resolved, we are  proceeding with chemotherapy and she will receive Zofran 16 mg, Decadron  20 mg and Ativan 0.5 mg followed by Taxotere at 25 mg per meter squared  for a total dose of 40 mg followed by carboplatin at an AUC of 2 for a  total dose of 220 mg, with Ativan 0.5 mg given every 6 hours after chemo  and metoclopramide 10 mg every 6 hours after chemo as needed.   Once the patient has completed the chemotherapy she will be ready for  discharge home.   PHYSICAL EXAMINATION:  VITAL SIGNS:  At the time of this dictation, the  patient's vital signs remained stable with a temperature of 98.7, pulse  72, respirations 17 and blood pressure 137/78.  Note that we have  lowered her lisinopril to 40 mg daily.  Oxygen saturation on room air  was  96%.  HEENT:  The oropharynx was clear.  There is no peripheral adenopathy.  LUNGS:  No crackles or wheezes with good excursion bilaterally.  HEART:  Regular rate and rhythm.  Port in place is accessed and  functioning well.  ABDOMEN:  Soft, nontender.  Bowel sounds are now considerably quieter.  The abdomen is nontender.  Masses are not palpated.  There is no  peripheral edema.  NEUROLOGIC:  Nonfocal.   LABORATORY DATA:  Lab work was obtained June 11, 2008, and showed a  white cell count of 9.5, neutrophils 7.0, hemoglobin 14.3, platelets  220,000, creatinine was 0.66.  Liver function tests were normal, except  for a mild elevation in the ALT at 48.   CONDITION ON DISCHARGE:  Improved.   DISCHARGE MEDICATIONS:  1. The patient will continue on vitamin D 2000 units daily.  2. Folate 1 mg daily.  3. Aspirin 325 mg daily.  4. Ativan 0.5 mg every 6 hours as needed.  5.  Metoclopramide 10 mg every 6 hours as needed.  6. Prinivil will be 40 mg daily.  7. Zofran 8 mg every 12 hours as needed.  8. Albuterol meter dose inhaler 2 puffs twice a day as needed.  9. Claritin 10 mg daily as needed.   DIET:  Regular.   ACTIVITY:  Unrestricted.   FOLLOW-UP:  She will return to our clinic on Jun 18, 2008, for follow-  up.  We will likely move her chemotherapy to Jun 20, 2008 instead of Jun 19, 2008, so it is a full week after this dose.  The patient is aware of  the appointment.      Valentino Hue. Magrinat, M.D.  Electronically Signed     GCM/MEDQ  D:  06/13/2008  T:  06/13/2008  Job:  [id number]   cc:   Lorne Skeens. Hoxworth, M.D.  [address], [address]  [place]  [place] [address]   W. Varney Baas, M.D.  Fax: [phone]   Maryln Gottron, M.D.  Fax: [phone]   Maryelizabeth Rowan, M.D.  Fax: [phone]   Deanna Artis. Sharene Skeans, M.D.  Fax: [phone]

## 2010-06-29 NOTE — Op Note (Signed)
[name], [name]                ACCOUNT NO.:  [id number]   MEDICAL RECORD NO.:  [id number]          PATIENT TYPE:  AMB   LOCATION:  SDS                          FACILITY:  [facility]   PHYSICIAN:  Sharlet Salina T. Hoxworth, M.D.DATE OF BIRTH:  Jun 23, 1945   DATE OF PROCEDURE:  04/07/2008  DATE OF DISCHARGE:                               OPERATIVE REPORT   PREOPERATIVE DIAGNOSIS:  Cancer of the right breast.   POSTOPERATIVE DIAGNOSIS:  Cancer of the right breast.   SURGICAL PROCEDURE:  Right modified mastectomy surgeon.   SURGEON:  Lorne Skeens. Hoxworth, MD   ANESTHESIA:  General.   BRIEF HISTORY:  Wanda Dorsey is a 65 year old female with a previous  history of lumpectomy, radiation, chemo, and hormonal therapy for a  lobular carcinoma of the right breast over 10 years ago.  She now  presents with a new mass in the right breast about 3 cm on imaging but  somewhat larger on exam in the upper outer quadrant.  Biopsy has  confirmed invasive lobular carcinoma.  I have recommended proceeding  with right mastectomy with sentinel node and possible axillary  dissection.  The nature of procedure, indications, risks of bleeding,  infection, nerve and vascular injury, lymphedema were all discussed and  understood.  She is now brought to the operating room for this  procedure.   PROCEDURE IN DETAIL:  The patient was brought to the operating room and  placed in supine position on the operating table and laryngeal mask  general anesthesia was induced.  The right arm was carefully positioned  and extended on an arm board.  She had previously undergone injection of  1 mCi of technetium sulfur colloid intradermally around the right  nipple.  With sterile technique, I injected 10 mL of methylene blue  subareolarly and massaged this for a couple of minutes.  The right  breast, chest, and arm were then widely sterilely prepped and draped.  Correct patient and procedure were verified.  She received  preoperative  IV antibiotics.  Pneumatic and embolism stockings were in place.  A  curvilinear incision was made encompassing the nipple-areolar complex  transversely and taking the skin overlying the mass as well.  Dissection  was carried down to the subcu.  Skin and subcutaneous flaps were then  raised superiorly up toward the clavicle, medially to the edge of the  sternum, inferiorly to the origin of the rectus, and laterally out to  the anterior border latissimus dorsi.  The axilla was then exposed and  the NeoProbe was used to identify the sentinel node.  There were  moderate counts fairly low, but they were diffuse throughout the axilla  and then just gradually increased as I went back to the breast.  I did  some blunt dissection down to the axilla and was not able to identify  any blue dye either.  As noted, the patient had had a previous sentinel  node biopsy in this axilla.  With this inability to identify clear  sentinel node and particularly in light of the large tumor and the  lobular histology, I  felt it was best to proceed with full axillary  dissection.  I was able to feel a slightly enlarged firm node as well in  the axilla, although it did not have high counts.  At this point, the  breast was reflected off the chest wall beginning medially with cautery,  dissecting off the pectoralis major and serratus.  The edge of  pectoralis major was completely cleared and the pectoralis minor  identified.  The clavipectoral fascia was incised.  The true axilla  entered.  The pectoralis minor retracted medially.  The axillary vessels  were identified and carefully protected.  Beginning about the medial  border of the pectoralis minor, all fibrofatty tissue inferior to the  axillary artery and vein were swept inferiorly and laterally.  Individual perforating vessels from the axillary artery and vein were  divided between clips.  As the dissection progressed laterally, the long   thoracic nerve of Bell was identified along the medial chest wall and  carefully protected.  Further dissection was carried out along the  axillary vessels until the thoracodorsal nerve and vessels were  identified and these were also carefully protected.  All fibrofatty  tissue then between the thoracodorsal nerve and vessels and chest wall  was then swept inferiorly down to the subscapularis.  Further  attachments along the serratus and latissimus dorsi were then divided  carefully protecting the nerves and the specimen was removed.  This  appeared to be a good thorough clean-out of the axilla.  The nerves were  intact and functioning.  The specimen was oriented and sent for  permanent pathology.  The wound was thoroughly irrigated with saline.  Two closed suction drains were brought out through separate stab wounds  and left beneath the flaps and the axilla.  Skin was then closed with  staples.  Sponge, needle, and instrument counts were correct.  Dry  dressings were applied.  The patient was taken to recovery in good  condition.      Lorne Skeens. Hoxworth, M.D.  Electronically Signed     BTH/MEDQ  D:  04/07/2008  T:  04/07/2008  Job:  [id number]

## 2010-11-11 IMAGING — CR DG CHEST 1V PORT
1 series · 1 of 1 positions shown · non-contrast
Comparison: CT chest 04/23/2008 and chest x-ray 04/03/2008

CLINICAL DATA: Port-A-Cath placement.

PORTABLE CHEST - 1 VIEW

[view not recorded]
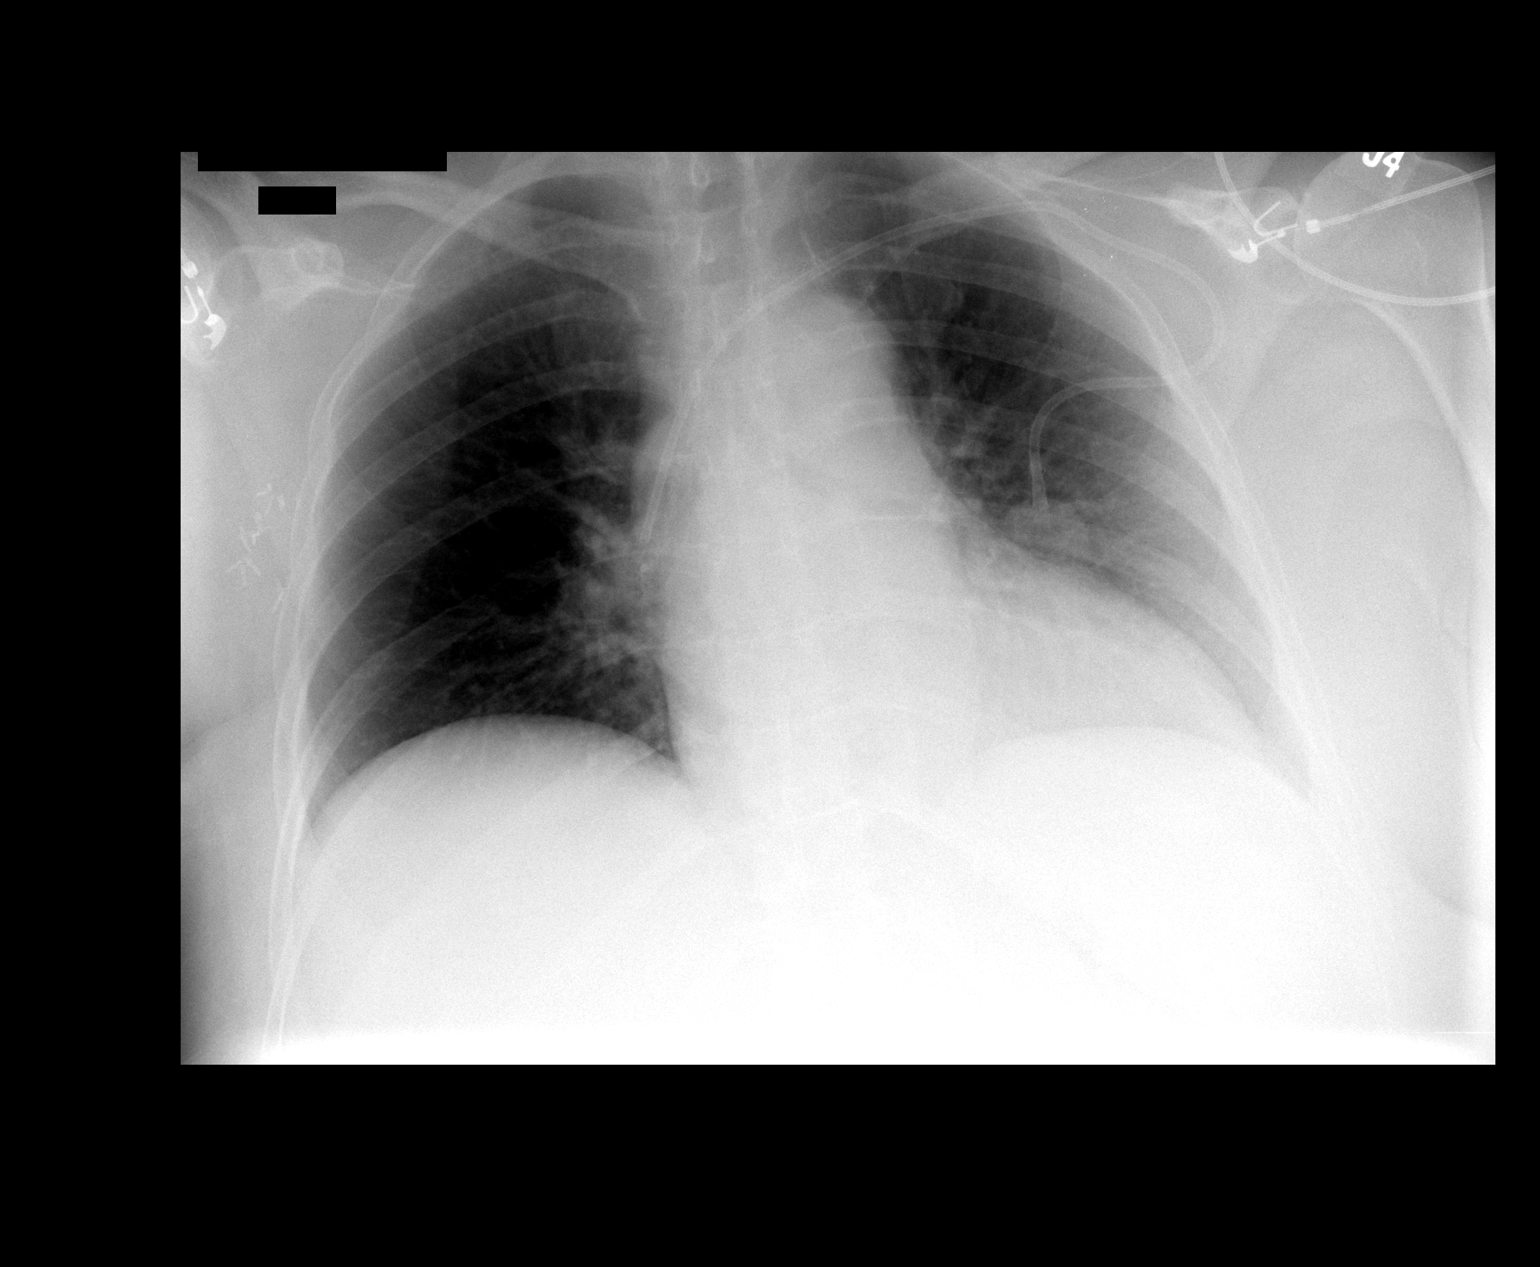

[1 of 1 positions shown; findings below may reference images not displayed]

FINDINGS: Trachea is midline.  Heart size stable.  Left subclavian
Port-A-Cath tip projects over the SVC.  No pneumothorax.  Lungs are
low in volume but clear. Surgical clips are seen in the right
axilla.
IMPRESSION: Left subclavian Port-A-Cath placement without complicating feature.

## 2010-12-02 IMAGING — CR DG ABDOMEN 2V
2 series · 2 of 2 positions shown · non-contrast
Comparison: Chest radiograph 04/03/2008

CLINICAL DATA: Severe constipation

ABDOMEN - 2 VIEW

[w abdomen upright]
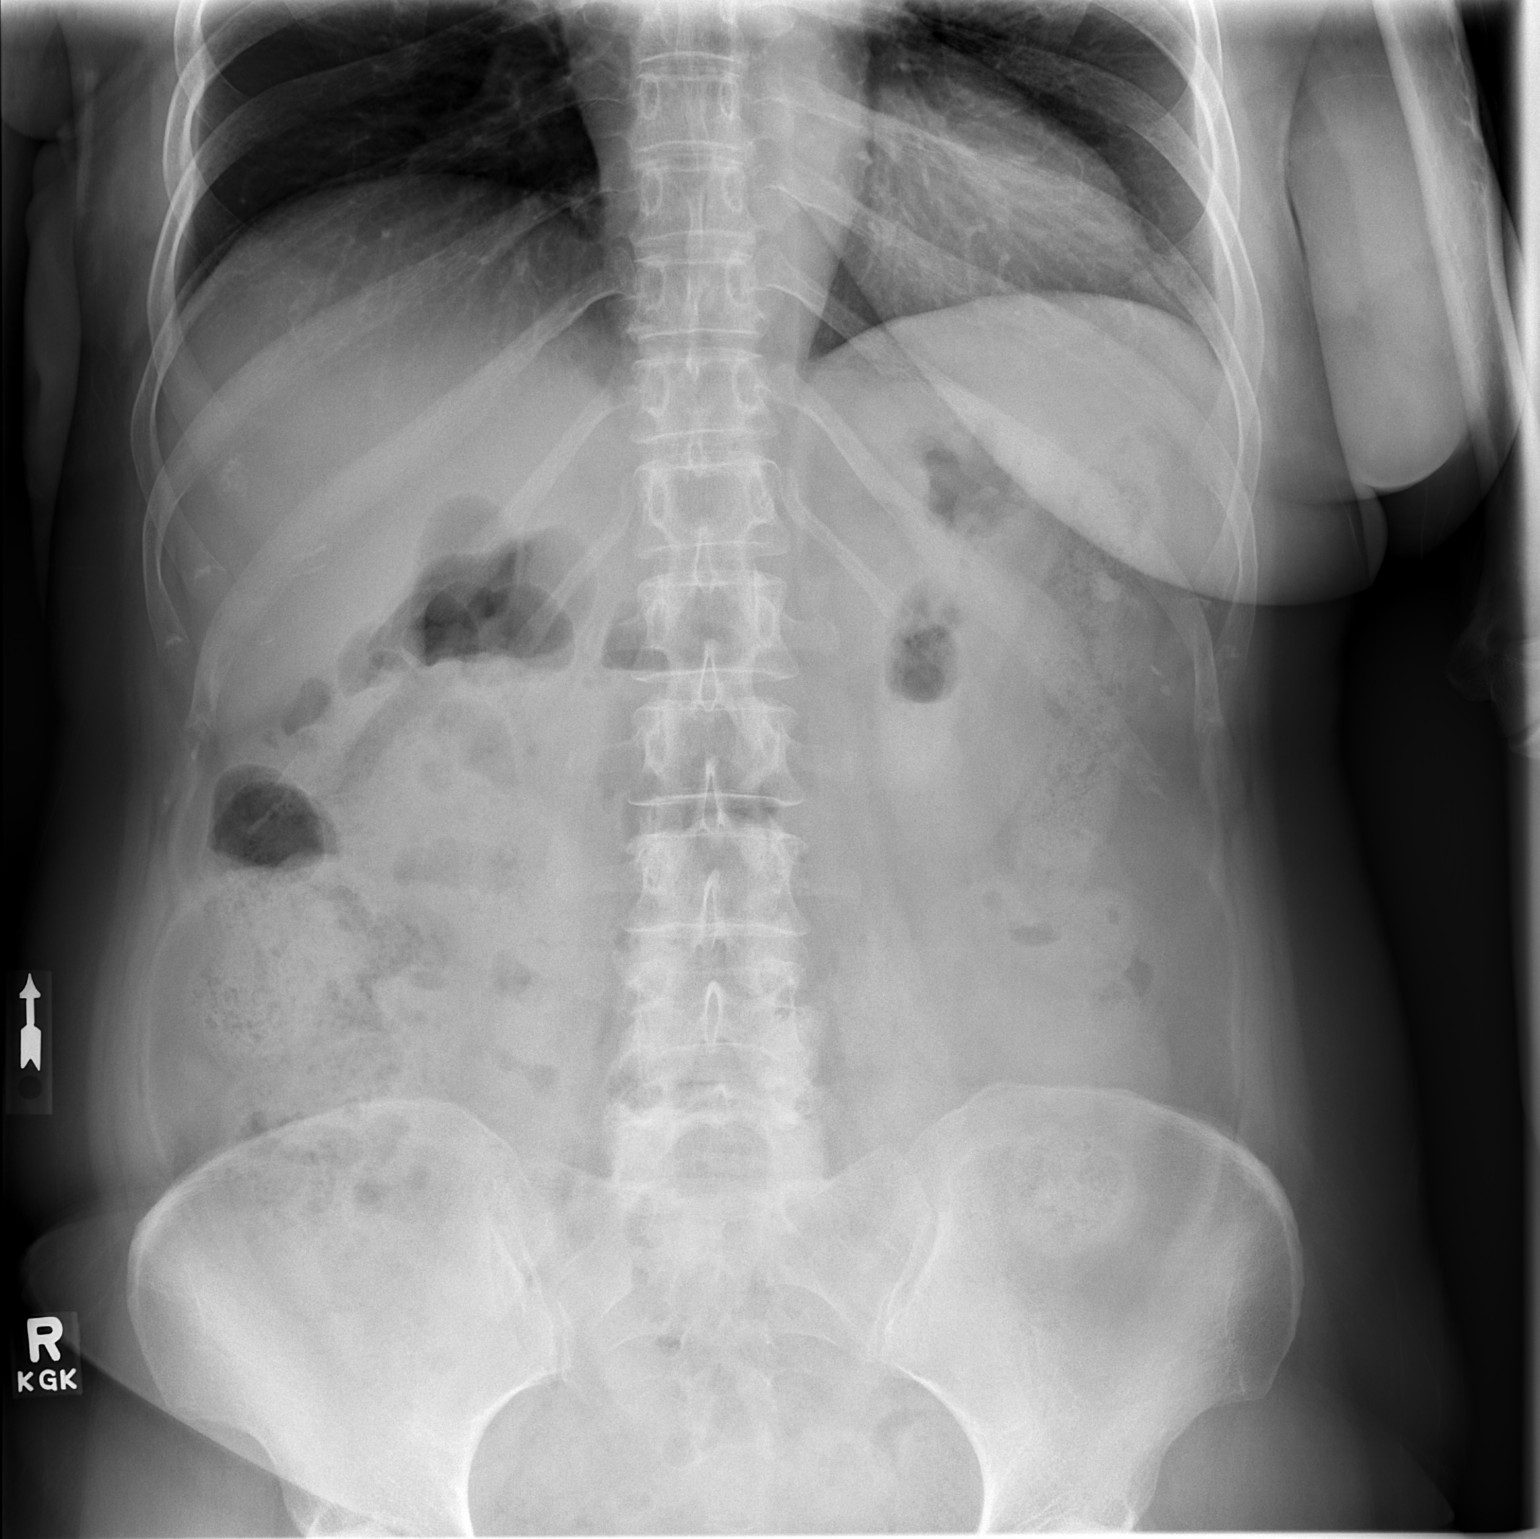

[t abdomen supine]
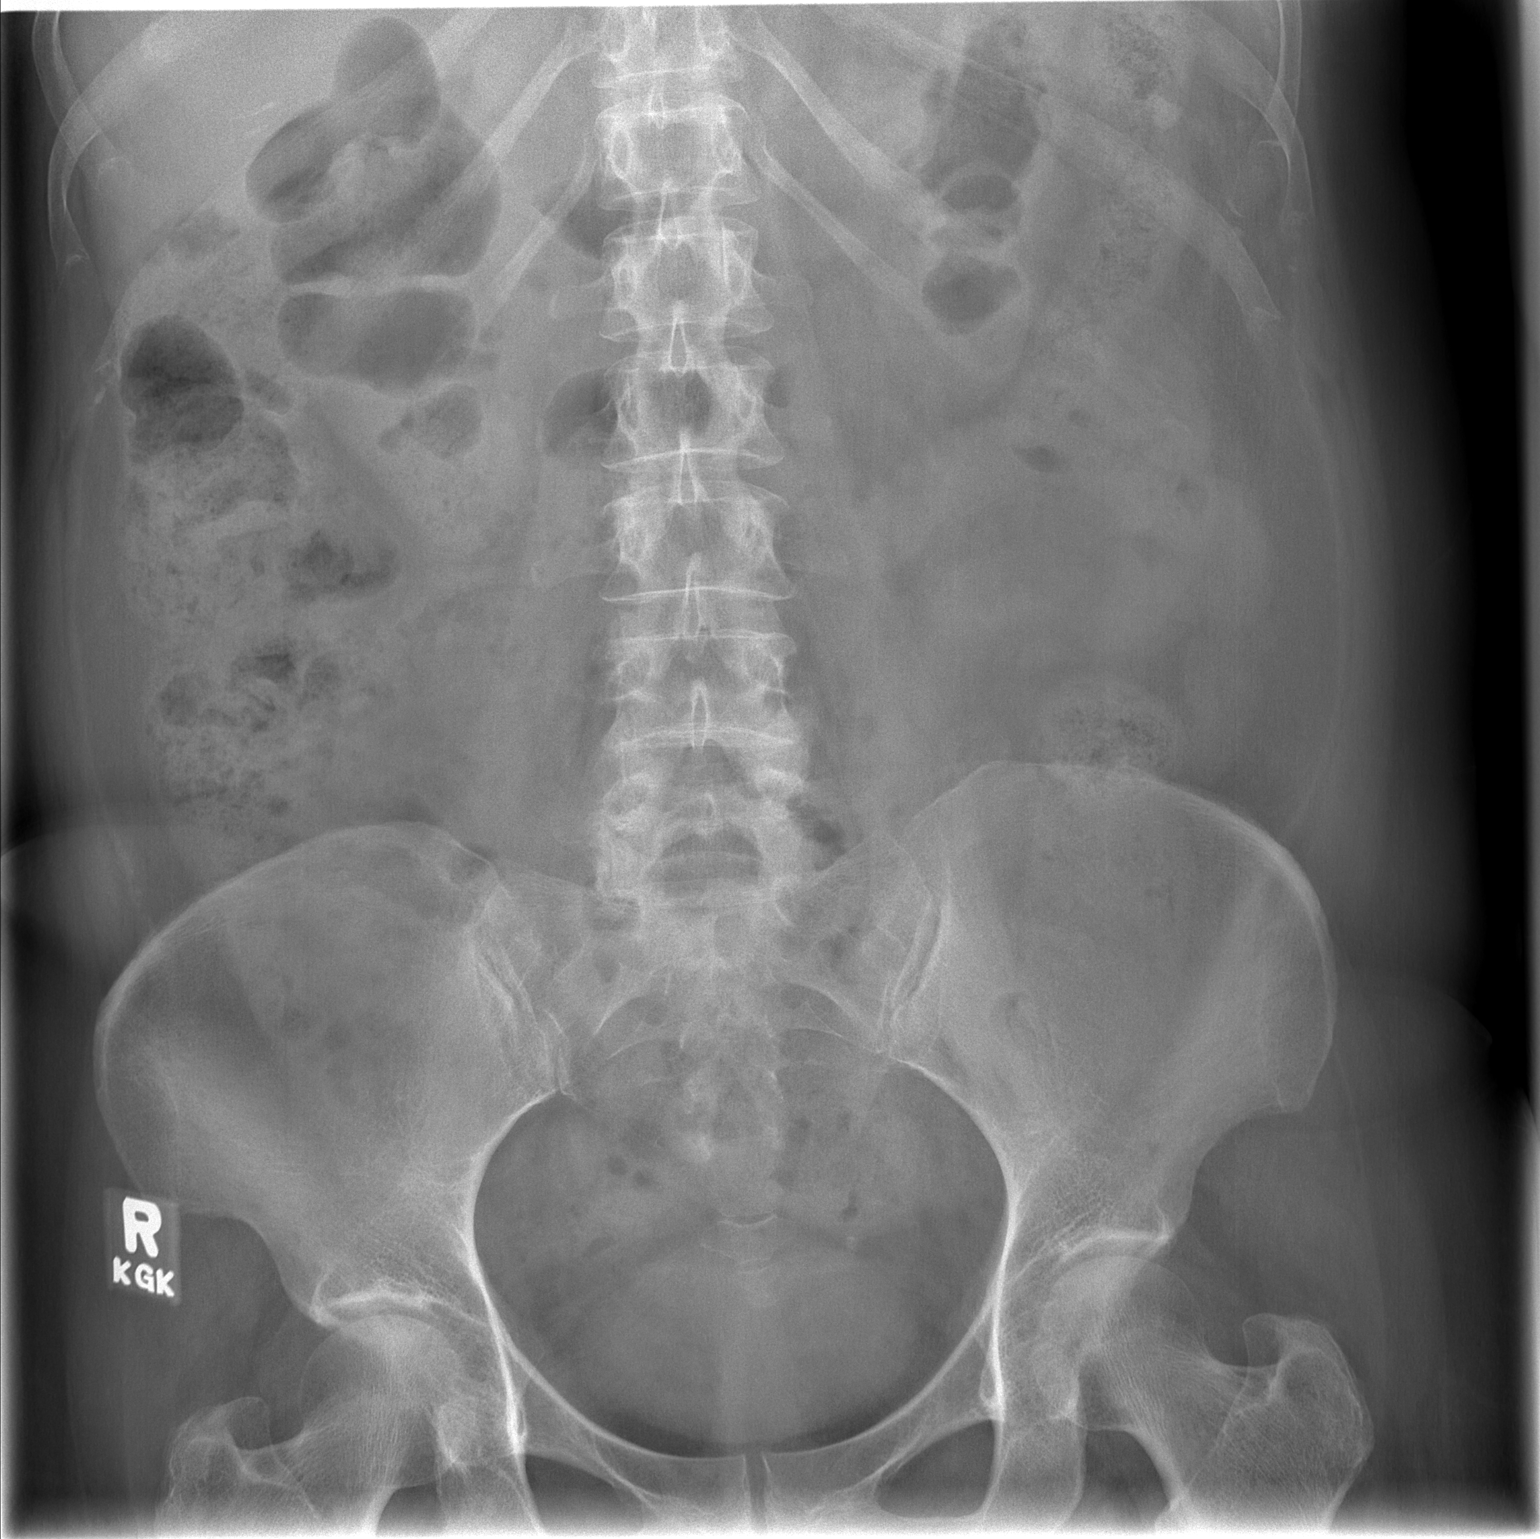

[2 of 2 positions shown; findings below may reference images not displayed]

FINDINGS: Visualized lung bases are clear.

No free intraperitoneal air is identified.

There are no dilated bowel loops.

Moderate amount of stool is identified in the proximal colon, and
small to moderate stool burden is noted in the distal colon.  No
significant stool is seen in the distal sigmoid/rectum.  No
radiopaque urinary tract calculus or acute osseous abnormality is
identified.
IMPRESSION: 1. Nonobstructive bowel gas pattern.
2.  Moderate amount of stool in the colon.  No evidence of
impaction.

## 2010-12-24 ENCOUNTER — Telehealth: Payer: Self-pay | Admitting: Oncology

## 2010-12-24 NOTE — Telephone Encounter (Signed)
PER POF 02/28 CALLED PT AND SCHEDULED APPT FOR [id number]

## 2011-04-26 ENCOUNTER — Other Ambulatory Visit (HOSPITAL_BASED_OUTPATIENT_CLINIC_OR_DEPARTMENT_OTHER): Payer: Medicare Other | Admitting: Lab

## 2011-04-26 DIAGNOSIS — C50419 Malignant neoplasm of upper-outer quadrant of unspecified female breast: Secondary | ICD-10-CM

## 2011-04-26 LAB — CBC WITH DIFFERENTIAL/PLATELET
Basophils Absolute: 0 10*3/uL (ref 0.0–0.1)
Eosinophils Absolute: 0.1 10*3/uL (ref 0.0–0.5)
HGB: 13.9 g/dL (ref 11.6–15.9)
MCV: 97.9 fL (ref 79.5–101.0)
MONO#: 0.5 10*3/uL (ref 0.1–0.9)
MONO%: 6.9 % (ref 0.0–14.0)
NEUT#: 4.7 10*3/uL (ref 1.5–6.5)
RDW: 13.6 % (ref 11.2–14.5)

## 2011-04-27 LAB — COMPREHENSIVE METABOLIC PANEL
Albumin: 4.4 g/dL (ref 3.5–5.2)
Alkaline Phosphatase: 57 U/L (ref 39–117)
BUN: 16 mg/dL (ref 6–23)
CO2: 24 mEq/L (ref 19–32)
Calcium: 10.7 mg/dL — ABNORMAL HIGH (ref 8.4–10.5)
Chloride: 105 mEq/L (ref 96–112)
Glucose, Bld: 101 mg/dL — ABNORMAL HIGH (ref 70–99)
Potassium: 4.1 mEq/L (ref 3.5–5.3)

## 2011-04-27 LAB — VITAMIN D 25 HYDROXY (VIT D DEFICIENCY, FRACTURES): Vit D, 25-Hydroxy: 88 ng/mL (ref 30–89)

## 2011-04-27 LAB — CANCER ANTIGEN 27.29: CA 27.29: 21 U/mL (ref 0–39)

## 2011-05-03 ENCOUNTER — Ambulatory Visit (HOSPITAL_BASED_OUTPATIENT_CLINIC_OR_DEPARTMENT_OTHER): Payer: Medicare Other | Admitting: Oncology

## 2011-05-03 ENCOUNTER — Telehealth: Payer: Self-pay | Admitting: *Deleted

## 2011-05-03 VITALS — BP 156/93 | HR 80 | Temp 98.3°F | Ht 60.0 in | Wt 132.5 lb

## 2011-05-03 DIAGNOSIS — Z170 Estrogen receptor positive status [ER+]: Secondary | ICD-10-CM

## 2011-05-03 DIAGNOSIS — Z853 Personal history of malignant neoplasm of breast: Secondary | ICD-10-CM

## 2011-05-03 DIAGNOSIS — Z9010 Acquired absence of unspecified breast and nipple: Secondary | ICD-10-CM

## 2011-05-03 DIAGNOSIS — I10 Essential (primary) hypertension: Secondary | ICD-10-CM

## 2011-05-03 DIAGNOSIS — Z79811 Long term (current) use of aromatase inhibitors: Secondary | ICD-10-CM

## 2011-05-03 DIAGNOSIS — C50919 Malignant neoplasm of unspecified site of unspecified female breast: Secondary | ICD-10-CM

## 2011-05-03 MED ORDER — LISINOPRIL 10 MG PO TABS: 10.0000 mg | ORAL_TABLET | Freq: Every day | ORAL | Status: DC

## 2011-05-03 MED ORDER — LISINOPRIL 20 MG PO TABS: 10.0000 mg | ORAL_TABLET | Freq: Every day | ORAL | Status: DC

## 2011-05-03 NOTE — Telephone Encounter (Signed)
gave patient appointment for 04-2012 printed out calendar and gave to the patient 

## 2011-05-08 DIAGNOSIS — C50919 Malignant neoplasm of unspecified site of unspecified female breast: Secondary | ICD-10-CM | POA: Insufficient documentation

## 2011-05-08 NOTE — Progress Notes (Signed)
 ID: Wanda Dorsey   DOB: Sep 08, 1945  MR#: [id number]  CSN#:[id number]  HISTORY OF PRESENT ILLNESS: [name]  had a right lumpectomy and sentinel lymph node dissection in 05/1997 for a 3 cm invasive lobular carcinoma, which was ER positive, lymph node negative, treated with Cytoxan and Adriamycin x 4 followed by radiation.  At that time she did not want tamoxifen, but agreed to Evista, which she took for perhaps a year or two, and then discontinued.  She was last seen in our office in 2006.  Subsequently she had screening mammography 03/25/2008 (the most recent mammogram prior to that was 09/2006), and this showed a suspicious mass in the right upper quadrant measuring up to 3.1 cm.  Ultrasound guided biopsy was performed the same day, and showed ([id number] and [id number]) an invasive lobular carcinoma which was ER positive at 51%, PR negative at 0% with an MIB-1 of 36%.  The tumor was HER-2 negative with a ratio of 1.03 by CISH.  With this information, the patient was referred to Dr. Johna Sheriff.  Since the patient had had prior radiation to that breast, mastectomy was really the only option, and this was performed on 04/07/2008.  The final pathology ([id number]) showed a 3.5 cm invasive lobular carcinoma, grade 3, with no lymphovascular invasion and 0/13 lymph nodes involved.  The deep margin was positive, and this was discussed extensively at the Multidisciplinary Breast Conference, with further local and systemic treatment as detailed below  INTERVAL HISTORY: B.J returns today with her husband Mac for routine follow-up of he breast cancer. The interval history is chiefly remarkable for her son's the upcoming marriage.  REVIEW OF SYSTEMS: She is tolerating the anastrozole without hot flashes or problems with vaginal dryness. She is not exercising regularly, but is walking her dog more frequently. She does belong to a  gym, but does have extensive gym equipment at home. She has a history of migraines, though what  she experiences is more like an aura, without the headache. These are rare, and have not increased in frequency or intensity recently. 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  History of seasonal allergies, history of hypertension, history of tonsillectomy and adenoidectomy, history of migraines, history of cataract in the left eye.  FAMILY HISTORY The patient's parents are alive, in their early nineties.  The patient has two sisters and one brother.  There is no history of breast or ovarian cancer in the immediate family.  The patient did have a great-grandmother with breast cancer, and a grandmother with ovarian cancer.  GYNECOLOGIC HISTORY: She is GX, P2, first pregnancy to term age 35.  She took estrogen between 1988 and 1992, she believes.    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 Their sons Will and Greggory Stallion both live in [place]. The patient is a [place].   ADVANCED DIRECTIVES: in place  HEALTH MAINTENANCE: History  Substance Use Topics  . Smoking status: Not on file  . Smokeless tobacco: Not on file  . Alcohol Use: Not on file     Colonoscopy: refuses  PAP: "about 15 years ago"  Bone density: August 2010, mild osteopenia  Lipid panel:  Allergies not on file  Current Outpatient Prescriptions  Medication Sig Dispense Refill  . B Complex-C (SUPER B COMPLEX PO) Take by mouth.      . cholecalciferol (VITAMIN D) 1000 UNITS tablet  Take 1,000 Units by mouth daily.      Marland Kitchen anastrozole (ARIMIDEX) 1 MG tablet       . lisinopril (PRINIVIL,ZESTRIL) 10 MG tablet Take 1 tablet (10 mg total) by mouth daily.  90 tablet  4    OBJECTIVE: Middle-aged white woman who appears well Filed Vitals:   05/03/11 1341  BP: 156/93  Pulse: 80  Temp: 98.3 F (36.8 C)     Body mass index is 25.88 kg/(m^2).    ECOG FS:  0  Sclerae unicteric Oropharynx clear No peripheral adenopathy Lungs no rales or rhonchi Heart regular rate and rhythm Abd benign MSK no focal spinal tenderness, no peripheral edema Neuro: nonfocal Breasts: The right breast is status post mastectomy; there is no evidence of local recurrence. The left breast is unremarkable  LAB RESULTS: Lab Results  Component Value Date   WBC 6.7 04/26/2011   NEUTROABS 4.7 04/26/2011   HGB 13.9 04/26/2011   HCT 40.7 04/26/2011   MCV 97.9 04/26/2011   PLT 208 04/26/2011      Chemistry      Component Value Date/Time   NA 143 04/26/2011 1347   K 4.1 04/26/2011 1347   CL 105 04/26/2011 1347   CO2 24 04/26/2011 1347   BUN 16 04/26/2011 1347   CREATININE 0.73 04/26/2011 1347      Component Value Date/Time   CALCIUM 10.7* 04/26/2011 1347   ALKPHOS 57 04/26/2011 1347   AST 17 04/26/2011 1347   ALT 14 04/26/2011 1347   BILITOT 0.5 04/26/2011 1347       Lab Results  Component Value Date   LABCA2 21 04/26/2011    No components found with this basename: [id number]    No results found for this basename: INR:1;PROTIME:1 in the last 168 hours  Urinalysis    Component Value Date/Time   COLORURINE YELLOW 05/19/2008 1355   APPEARANCEUR CLEAR 05/19/2008 1355   LABSPEC 1.019 05/19/2008 1355   PHURINE 7.0 05/19/2008 1355   GLUCOSEU NEGATIVE 05/19/2008 1355   HGBUR NEGATIVE 05/19/2008 1355   BILIRUBINUR NEGATIVE 05/19/2008 1355   KETONESUR NEGATIVE 05/19/2008 1355   PROTEINUR NEGATIVE 05/19/2008 1355   UROBILINOGEN 0.2 05/19/2008 1355   NITRITE NEGATIVE 05/19/2008 1355   LEUKOCYTESUR MODERATE* 05/19/2008 1355    STUDIES: Mammography earlier this year was "fine". We will obtain that report from [facility]  ASSESSMENT: 66 year-old [place] woman   (1) status post right lumpectomy April of 1999 for a T2 N0, stage IIA invasive lobular carcinoma, estrogen receptor positive, progesterone receptor and HER2 negative, with a low MIB-1, treated with cyclophosphamide and doxorubicin x 4  followed by radiation, followed by raloxifene for one year.  (2) status post right mastectomy February of 2010 for what was most likely a local recurrence, this being a T2 N0, stage IIA  invasive lobular carcinoma,  grade 3, which was again ER positive, PR negative and HER2 negative with an MIB-1 of 36%.  There was a focally positive deep margin at the pectoral muscle but all 13 lymph nodes were negative.  An OncoType DX showed a recurrence score of 37%, predicting a 26% risk of relapse after 5 years of antiestrogens.    (3) status post carboplatin and docetaxel x 4, completed in July of 2010;  because of the concern regarding margins she also received radiation to the right chest wall.  Anastrozole  was started when she finished radiation in January of 2011.    PLAN: The plan is to continue anastrozole in  minimum of 5 years and more likely continue for a total of 10. There is no evidence of disease recurrence now 3 years out from her mastectomy, which is very favorable. She is aware of the importance of osteoporosis prevention, and continues to take vitamin D daily. She knows to call for any problems that may develop before the next visit, which will be in one year   , C    05/08/2011

## 2011-05-26 ENCOUNTER — Encounter: Payer: Self-pay | Admitting: Oncology

## 2011-06-11 ENCOUNTER — Other Ambulatory Visit: Payer: Self-pay | Admitting: Oncology

## 2012-04-25 ENCOUNTER — Telehealth: Payer: Self-pay | Admitting: *Deleted

## 2012-04-25 ENCOUNTER — Other Ambulatory Visit: Payer: Medicare Other | Admitting: Lab

## 2012-04-25 NOTE — Telephone Encounter (Signed)
sw pt. she wanted to reschedule her March appts to May due to working on a project. appt d/t was given for 06/26/2012 lab @ 1:45 pm and MD on 07/02/2012 @ 2:30pm.

## 2012-05-03 ENCOUNTER — Ambulatory Visit: Payer: Medicare Other | Admitting: Oncology

## 2012-06-11 ENCOUNTER — Telehealth: Payer: Self-pay | Admitting: *Deleted

## 2012-06-11 NOTE — Telephone Encounter (Signed)
Pt called to cancel her appts for 06/26/12 and 07/02/12. Pt made me aware that she would call back at a later time to r/s...td

## 2012-06-26 ENCOUNTER — Other Ambulatory Visit: Payer: Medicare Other | Admitting: Lab

## 2012-07-02 ENCOUNTER — Ambulatory Visit: Payer: Medicare Other | Admitting: Oncology

## 2012-07-27 ENCOUNTER — Other Ambulatory Visit: Payer: Self-pay | Admitting: Oncology

## 2012-08-03 ENCOUNTER — Other Ambulatory Visit: Payer: Self-pay | Admitting: *Deleted

## 2012-12-06 ENCOUNTER — Ambulatory Visit: Payer: Self-pay | Admitting: Podiatrist

## 2012-12-07 ENCOUNTER — Ambulatory Visit: Payer: Self-pay | Admitting: Podiatrist

## 2013-09-10 ENCOUNTER — Telehealth: Payer: Self-pay

## 2013-09-10 NOTE — Telephone Encounter (Signed)
Therapeutic alert rcvd from [facility] for lisinopril.  Original to scan.  Copy to scan.

## 2013-09-12 ENCOUNTER — Other Ambulatory Visit: Payer: Self-pay | Admitting: Oncology

## 2013-10-04 ENCOUNTER — Ambulatory Visit (INDEPENDENT_AMBULATORY_CARE_PROVIDER_SITE_OTHER): Payer: Medicare Other | Admitting: Podiatrist

## 2013-10-04 ENCOUNTER — Encounter: Payer: Self-pay | Admitting: Podiatrist

## 2013-10-04 VITALS — BP 141/86 | HR 89 | Resp 18

## 2013-10-04 DIAGNOSIS — G5762 Lesion of plantar nerve, left lower limb: Secondary | ICD-10-CM

## 2013-10-04 DIAGNOSIS — G5760 Lesion of plantar nerve, unspecified lower limb: Secondary | ICD-10-CM

## 2013-10-04 NOTE — Progress Notes (Signed)
   Subjective:    Patient ID: Wanda Dorsey, female    DOB: [birth year], 68 y.o.   MRN: [id number]  HPI I JUST NEED A BLAST OF SOMETHING FOR MY LEFT FOOT     Review of Systems  All other systems reviewed and are negative.      Objective:   Physical Exam Patient is awake, alert, and oriented x 3.  In no acute distress.  Vascular status is intact with palpable pedal pulses at 2/4 DP and PT bilateral and capillary refill time within normal limits. Neurological sensation is also intact bilaterally via Semmes Weinstein monofilament at 5/5 sites. Light touch, vibratory sensation, Achilles tendon reflex is intact. Dermatological exam reveals skin color, turger and texture as normal. No open lesions present.  Musculature intact with dorsiflexion, plantarflexion, inversion, eversion.  Pain on palpation second interspace left foot is noted consistent with a neuroma. About once a year we do an alcohol sclerosing injection and at this time she feels she is ready for one.    Assessment & Plan:  Neuroma second interspace left foot  Plan: Alcohol sclerosing injection was infiltrated to the second interspace of the left foot at the point of maximal tenderness without complication. She will be seen back as needed for followup.

## 2014-05-23 ENCOUNTER — Ambulatory Visit (INDEPENDENT_AMBULATORY_CARE_PROVIDER_SITE_OTHER): Payer: Medicare Other | Admitting: Podiatrist

## 2014-05-23 ENCOUNTER — Encounter: Payer: Self-pay | Admitting: Podiatrist

## 2014-05-23 VITALS — BP 124/89 | HR 100 | Resp 12

## 2014-05-23 DIAGNOSIS — G5762 Lesion of plantar nerve, left lower limb: Secondary | ICD-10-CM | POA: Diagnosis not present

## 2014-05-23 NOTE — Progress Notes (Signed)
   Subjective:    Patient ID: Wanda Dorsey, female    DOB: [birth year], 69 y.o.   MRN: [id number]   Chief Complaint  Patient presents with  . Foot Pain    ''LT FOOT START BOTHERING ME AGAIN.''      Objective:   Physical Exam Patient is awake, alert, and oriented x 3.  In no acute distress.  Vascular status is intact with palpable pedal pulses at 2/4 DP and PT bilateral and capillary refill time within normal limits. Neurological sensation is also intact bilaterally via Semmes Weinstein monofilament at 5/5 sites. Light touch, vibratory sensation, Achilles tendon reflex is intact. Dermatological exam reveals skin color, turger and texture as normal. No open lesions present.  Musculature intact with dorsiflexion, plantarflexion, inversion, eversion.  Pain on palpation second interspace left foot is noted consistent with a neuroma. About once a year we do an alcohol sclerosing injection and at this time she feels she is ready for one.    Assessment & Plan:  Neuroma second interspace left foot  Plan: Alcohol sclerosing injection was infiltrated to the second interspace of the left foot at the point of maximal tenderness without complication. She will be seen back as needed for followup. She is getting ready to go to [place] with her sister for her birthday. Ask how the trip went at the next visit.

## 2014-08-07 ENCOUNTER — Encounter (HOSPITAL_COMMUNITY): Payer: Self-pay | Admitting: *Deleted

## 2014-08-07 ENCOUNTER — Emergency Department (HOSPITAL_COMMUNITY)
Admission: EM | Admit: 2014-08-07 | Discharge: 2014-08-08 | Disposition: A | Discharge status: Home / Self Care | Payer: Medicare Other | Attending: Emergency Medicine | Admitting: Emergency Medicine

## 2014-08-07 DIAGNOSIS — F1310 Sedative, hypnotic or anxiolytic abuse, uncomplicated: Secondary | ICD-10-CM | POA: Insufficient documentation

## 2014-08-07 DIAGNOSIS — Z859 Personal history of malignant neoplasm, unspecified: Secondary | ICD-10-CM | POA: Insufficient documentation

## 2014-08-07 DIAGNOSIS — R531 Weakness: Secondary | ICD-10-CM | POA: Insufficient documentation

## 2014-08-07 DIAGNOSIS — T424X5A Adverse effect of benzodiazepines, initial encounter: Secondary | ICD-10-CM | POA: Insufficient documentation

## 2014-08-07 DIAGNOSIS — I10 Essential (primary) hypertension: Secondary | ICD-10-CM | POA: Insufficient documentation

## 2014-08-07 DIAGNOSIS — H9190 Unspecified hearing loss, unspecified ear: Secondary | ICD-10-CM | POA: Diagnosis not present

## 2014-08-07 DIAGNOSIS — Z79899 Other long term (current) drug therapy: Secondary | ICD-10-CM | POA: Insufficient documentation

## 2014-08-07 DIAGNOSIS — T50905A Adverse effect of unspecified drugs, medicaments and biological substances, initial encounter: Secondary | ICD-10-CM

## 2014-08-07 LAB — CBC WITH DIFFERENTIAL/PLATELET
BASOS ABS: 0 10*3/uL (ref 0.0–0.1)
BASOS PCT: 1 % (ref 0–1)
EOS ABS: 0.4 10*3/uL (ref 0.0–0.7)
Eosinophils Relative: 6 % — ABNORMAL HIGH (ref 0–5)
HCT: 46.8 % — ABNORMAL HIGH (ref 36.0–46.0)
Hemoglobin: 15.5 g/dL — ABNORMAL HIGH (ref 12.0–15.0)
Lymphocytes Relative: 28 % (ref 12–46)
Lymphs Abs: 1.6 10*3/uL (ref 0.7–4.0)
MCH: 32.7 pg (ref 26.0–34.0)
MCHC: 33.1 g/dL (ref 30.0–36.0)
MCV: 98.7 fL (ref 78.0–100.0)
Monocytes Absolute: 0.7 10*3/uL (ref 0.1–1.0)
Monocytes Relative: 12 % (ref 3–12)
NEUTROS PCT: 53 % (ref 43–77)
Neutro Abs: 3.1 10*3/uL (ref 1.7–7.7)
PLATELETS: 216 10*3/uL (ref 150–400)
RBC: 4.74 MIL/uL (ref 3.87–5.11)
RDW: 13 % (ref 11.5–15.5)
WBC: 5.8 10*3/uL (ref 4.0–10.5)

## 2014-08-07 LAB — RAPID URINE DRUG SCREEN, HOSP PERFORMED
Amphetamines: NOT DETECTED
Barbiturates: NOT DETECTED
Benzodiazepines: POSITIVE — AB
COCAINE: NOT DETECTED
OPIATES: NOT DETECTED
Tetrahydrocannabinol: NOT DETECTED

## 2014-08-07 LAB — URINALYSIS, ROUTINE W REFLEX MICROSCOPIC
Bilirubin Urine: NEGATIVE
Glucose, UA: NEGATIVE mg/dL
HGB URINE DIPSTICK: NEGATIVE
KETONES UR: NEGATIVE mg/dL
Nitrite: NEGATIVE
PH: 7 (ref 5.0–8.0)
Protein, ur: NEGATIVE mg/dL
Specific Gravity, Urine: 1.011 (ref 1.005–1.030)
Urobilinogen, UA: 0.2 mg/dL (ref 0.0–1.0)

## 2014-08-07 LAB — COMPREHENSIVE METABOLIC PANEL
ALT: 33 U/L (ref 14–54)
AST: 24 U/L (ref 15–41)
Albumin: 3.6 g/dL (ref 3.5–5.0)
Alkaline Phosphatase: 78 U/L (ref 38–126)
Anion gap: 9 (ref 5–15)
BUN: 11 mg/dL (ref 6–20)
CO2: 30 mmol/L (ref 22–32)
Calcium: 9.4 mg/dL (ref 8.9–10.3)
Chloride: 99 mmol/L — ABNORMAL LOW (ref 101–111)
Creatinine, Ser: 0.75 mg/dL (ref 0.44–1.00)
GFR calc Af Amer: >60 mL/min (ref >60)
Glucose, Bld: 128 mg/dL — ABNORMAL HIGH (ref 65–99)
Potassium: 4 mmol/L (ref 3.5–5.1)
SODIUM: 138 mmol/L (ref 135–145)
Total Bilirubin: 0.7 mg/dL (ref 0.3–1.2)
Total Protein: 6.9 g/dL (ref 6.5–8.1)

## 2014-08-07 LAB — URINE MICROSCOPIC-ADD ON

## 2014-08-07 LAB — ETHANOL: Alcohol, Ethyl (B): <5 mg/dL (ref <5)

## 2014-08-07 NOTE — Discharge Instructions (Signed)
Decrease your Librium dose.  Take 25 mg (one tablet), a.m. and p.m. for any symptoms including agitation, tremor, restlessness, hypertension.  If you feel you are getting worse despite this dosage you may increase it up to one every 8 hours.  You may return at any time with any failure to improve at home with these recommendations.

## 2014-08-07 NOTE — ED Provider Notes (Signed)
Patient seen and evaluated. Discussed with Marcene Brawn PA. Patient complains of feeling lethargic and sleeping a lot with her current Librium taper. 200 mg on Monday, 300 mg on Tuesday and Wednesday, 200 mg today.  She normally drinks two thirds of a bottle of vodka daily. Her last drink was Sunday. She cannot tell with the last time that she had more than 4 hours without alcohol. At this point I think she can decrease her dosing of her Librium. I've asked her to take 25 mg every 8 hours as needed for any which all symptoms including shakes tremors and hypertension.  Her friend that is staying with her is an Therapist, sports. The patient "Wanda Dorsey" is adamant she does not want to stay at behavioral health and feels comfortable caring for herself at home.  Tanna Furry, MD 08/07/14 [phone]

## 2014-08-07 NOTE — ED Notes (Signed)
Per EMS report: pt being tx for outpatient.  She has been taking librium and the dosage decreased a few days ago. Pt has been feeling weak, dizzy, and experiencing an unsteady gait. Pt a/o x 4. Skin warm and dry. EMS VS: BP: 120/98, HR: 80, RR: 18 CBG: 136

## 2014-08-07 NOTE — ED Notes (Signed)
Pt attempted to give urine sample, unable, given something to drink.

## 2014-08-07 NOTE — ED Provider Notes (Signed)
CSN: [id number]     Arrival date & time 08/07/14  1754 History   First MD Initiated Contact with Patient 08/07/14 1858     Chief Complaint  Patient presents with  . Weakness     (Consider location/radiation/quality/duration/timing/severity/associated sxs/prior Treatment) Patient is a 69 y.o. female presenting with weakness. The history is provided by the patient. No language interpreter was used.  Weakness This is a new problem. The current episode started today. The problem occurs constantly. The problem has been gradually worsening. Associated symptoms include weakness. Nothing aggravates the symptoms. She has tried nothing for the symptoms. The treatment provided moderate relief.  Pt complains of weakness.  Pt reports she is on a librium taper.  Pt reports she has a history of alcohol abuse.  Pt reports she did not want inpatient treatment.  Pt has been sleepy since starting medication.  Pt reports she is sleeping 23 hours a day.  Past Medical History  Diagnosis Date  . Hypertension   . Cancer   . Hearing loss    Past Surgical History  Procedure Laterality Date  . Cesarean section    . Breast surgery     No family history on file. History  Substance Use Topics  . Smoking status: Never Smoker   . Smokeless tobacco: Never Used  . Alcohol Use: No   OB History    No data available     Review of Systems  Neurological: Positive for weakness.  All other systems reviewed and are negative.     Allergies  Penicillins  Home Medications   Prior to Admission medications   Medication Sig Start Date End Date Taking? Authorizing Provider  anastrozole (ARIMIDEX) 1 MG tablet Take 1 mg by mouth daily.  04/10/11  Yes Historical Provider, MD  B Complex-C (SUPER B COMPLEX PO) Take 1 tablet by mouth daily.    Yes Historical Provider, MD  chlordiazePOXIDE (LIBRIUM) 25 MG capsule Take 25 mg by mouth See admin instructions. Take 2 caps by mouth 4 times daily x 2 days 2 caps  3 times  daily x 2 days 1 cap 4 times daily x 2 days 1 cap 3 times daily x 2 days 1 cap 2 times daily x 2 days   Yes Historical Provider, MD  cholecalciferol (VITAMIN D) 1000 UNITS tablet Take 1,000 Units by mouth daily.   Yes Historical Provider, MD  lisinopril (PRINIVIL,ZESTRIL) 20 MG tablet Take 20 mg by mouth daily.  03/24/14  Yes Historical Provider, MD  PROAIR HFA 108 (90 BASE) MCG/ACT inhaler Inhale 2 puffs into the lungs every 4 (four) hours as needed for wheezing or shortness of breath.  06/27/13  Yes Historical Provider, MD  lisinopril (PRINIVIL,ZESTRIL) 10 MG tablet Take 1 tablet (10 mg total) by mouth daily. Patient not taking: Reported on 08/07/2014 05/03/11   Chauncey Cruel, MD   BP 132/92 mmHg  Pulse 95  Temp(Src) 100.1 F (37.8 C) (Oral)  Resp 16  SpO2 97% Physical Exam  Constitutional: She is oriented to person, place, and time. She appears well-developed and well-nourished.  HENT:  Head: Normocephalic and atraumatic.  Right Ear: External ear normal.  Left Ear: External ear normal.  Nose: Nose normal.  Mouth/Throat: Oropharynx is clear and moist.  Eyes: Conjunctivae and EOM are normal. Pupils are equal, round, and reactive to light.  Cardiovascular: Normal rate, regular rhythm and normal heart sounds.   Pulmonary/Chest: Effort normal and breath sounds normal.  Abdominal: Soft. Bowel sounds are normal.  Musculoskeletal: Normal range of motion.  Neurological: She is alert and oriented to person, place, and time.  Skin: Skin is warm.  Psychiatric: She has a normal mood and affect.  Nursing note and vitals reviewed.   ED Course  Procedures (including critical care time) Labs Review Labs Reviewed  CBC WITH DIFFERENTIAL/PLATELET - Abnormal; Notable for the following:    Hemoglobin 15.5 (*)    HCT 46.8 (*)    Eosinophils Relative 6 (*)    All other components within normal limits  COMPREHENSIVE METABOLIC PANEL - Abnormal; Notable for the following:    Chloride 99 (*)     Glucose, Bld 128 (*)    All other components within normal limits  URINALYSIS, ROUTINE W REFLEX MICROSCOPIC (NOT AT [facility]) - Abnormal; Notable for the following:    APPearance CLOUDY (*)    Leukocytes, UA SMALL (*)    All other components within normal limits  ETHANOL  URINE MICROSCOPIC-ADD ON  URINE RAPID DRUG SCREEN, HOSP PERFORMED    Imaging Review No results found.   EKG Interpretation None      MDM  Pt's dosage is decreasing daily.   I think the tapering dosage should result in decreased sedation.   Pt advised to contact her MD for recheck   Final diagnoses:  Medication adverse effect, initial encounter    Dr. Jeneen Rinks in to see.     Hollace Kinnier Flaxton, PA-C 08/08/14 [place], MD 08/15/14 [phone]

## 2014-08-07 NOTE — Progress Notes (Signed)
EDCM spoke to patient and her family at bedside.  Patient reports her pcp is Dr. Mingo Amber on [address] unable to find this provider.  Patient's family member reports Dr. Garnetta Buddy PA with [facility] is the provider who prescribes patient's librium.

## 2015-08-26 ENCOUNTER — Encounter (HOSPITAL_COMMUNITY): Payer: Self-pay | Admitting: Emergency Medicine

## 2015-08-26 ENCOUNTER — Emergency Department (HOSPITAL_COMMUNITY)
Admission: EM | Admit: 2015-08-26 | Discharge: 2015-08-27 | Disposition: A | Discharge status: Home / Self Care | Payer: Medicare Other | Attending: Emergency Medicine | Admitting: Emergency Medicine

## 2015-08-26 DIAGNOSIS — Z859 Personal history of malignant neoplasm, unspecified: Secondary | ICD-10-CM | POA: Insufficient documentation

## 2015-08-26 DIAGNOSIS — I10 Essential (primary) hypertension: Secondary | ICD-10-CM | POA: Diagnosis not present

## 2015-08-26 DIAGNOSIS — Z79899 Other long term (current) drug therapy: Secondary | ICD-10-CM | POA: Insufficient documentation

## 2015-08-26 DIAGNOSIS — F419 Anxiety disorder, unspecified: Secondary | ICD-10-CM

## 2015-08-26 DIAGNOSIS — F10930 Alcohol use, unspecified with withdrawal, uncomplicated: Secondary | ICD-10-CM

## 2015-08-26 DIAGNOSIS — F10230 Alcohol dependence with withdrawal, uncomplicated: Secondary | ICD-10-CM | POA: Insufficient documentation

## 2015-08-26 DIAGNOSIS — Z87891 Personal history of nicotine dependence: Secondary | ICD-10-CM | POA: Insufficient documentation

## 2015-08-26 LAB — CBC WITH DIFFERENTIAL/PLATELET
BASOS ABS: 0 10*3/uL (ref 0.0–0.1)
Basophils Relative: 0 %
Eosinophils Absolute: 0 10*3/uL (ref 0.0–0.7)
Eosinophils Relative: 0 %
HCT: 42.2 % (ref 36.0–46.0)
HEMOGLOBIN: 14.5 g/dL (ref 12.0–15.0)
LYMPHS ABS: 2.3 10*3/uL (ref 0.7–4.0)
Lymphocytes Relative: 23 %
MCH: 32.7 pg (ref 26.0–34.0)
MCHC: 34.4 g/dL (ref 30.0–36.0)
MCV: 95.3 fL (ref 78.0–100.0)
Monocytes Absolute: 0.7 10*3/uL (ref 0.1–1.0)
Monocytes Relative: 7 %
NEUTROS ABS: 7 10*3/uL (ref 1.7–7.7)
NEUTROS PCT: 70 %
Platelets: 240 10*3/uL (ref 150–400)
RBC: 4.43 MIL/uL (ref 3.87–5.11)
RDW: 13.2 % (ref 11.5–15.5)
WBC: 10 10*3/uL (ref 4.0–10.5)

## 2015-08-26 MED ORDER — LORAZEPAM 1 MG PO TABS
1.0000 mg | ORAL_TABLET | Freq: Once | ORAL | Status: AC
  Administered 2015-08-26: 1 mg via ORAL
  Filled 2015-08-26: qty 1

## 2015-08-26 MED ORDER — SODIUM CHLORIDE 0.9 % IV SOLN
Freq: Once | INTRAVENOUS | Status: AC
  Administered 2015-08-26: 23:00:00 via INTRAVENOUS

## 2015-08-26 NOTE — ED Provider Notes (Signed)
CSN: [id number]     Arrival date & time 08/26/15  1724 History  By signing my name below, I, Randa Evens, attest that this documentation has been prepared under the direction and in the presence of Junius Creamer, NP. Electronically Signed: Randa Evens, ED Scribe. 08/26/2015. 10:45 PM.    Chief Complaint  Patient presents with  . Hypertension   Patient is a 70 y.o. female presenting with hypertension. The history is provided by the patient. No language interpreter was used.  Hypertension Associated symptoms include headaches.   HPI Comments: Wanda Dorsey is a 70 y.o. female who presents to the Emergency Department complaining of possible HTN tonight. Pt states that she checked her blood pressure tonight and was concerned about the diastolic number. Pt reports that about 1 month ago her PCP increased her lisinopril to 40 Mg daily. Pt states she has taking about 80 Mg of lisinopril in the last 24 hours with no relief( 40 at 10 PM yesterday than 20 mg at noon and again at 3 PM today) Pt states that her BP is usually in the 150/100. Pt reports that she is dealing with alcohol abuse. Pt states that she drinks about 18 oz of vodka daily. Pt states she is currently trying to taper her self until she is completely sober. Today she has consumed less than 8 ounces. Pt also reports that she is having a slight HA tonight as well but relates that to not eating much in the last 24 hours. Pt also reports that she feels as if her abdomen is distended as well. Pt reports Hx of GERD and states that her stomach almost always feel irritates. Pt reports a lot of recent stressors in her life as well. Pt denies leg swelling. Pt reports Hx of Breat caner in 2010 and 1999.     Past Medical History  Diagnosis Date  . Hypertension   . Cancer ([place])   . Hearing loss    Past Surgical History  Procedure Laterality Date  . Cesarean section    . Breast surgery     No family history on file. Social History   Substance Use Topics  . Smoking status: Former Research scientist (life sciences)  . Smokeless tobacco: Never Used  . Alcohol Use: Yes     Comment: 80 proff vodka approx 20 oz daily    OB History    No data available      Review of Systems  Neurological: Positive for headaches.  All other systems reviewed and are negative.    Allergies  Other and Penicillins  Home Medications   Prior to Admission medications   Medication Sig Start Date End Date Taking? Authorizing Provider  anastrozole (ARIMIDEX) 1 MG tablet Take 1 mg by mouth daily.  04/10/11  Yes Historical Provider, MD  B Complex-C (SUPER B COMPLEX PO) Take 1 tablet by mouth daily.    Yes Historical Provider, MD  cetirizine (ZYRTEC) 10 MG tablet Take 10 mg by mouth daily as needed for allergies.   Yes Historical Provider, MD  cholecalciferol (VITAMIN D) 1000 UNITS tablet Take 1,000 Units by mouth daily.   Yes Historical Provider, MD  omeprazole (PRILOSEC) 20 MG capsule Take 20 mg by mouth daily.   Yes Historical Provider, MD  PROAIR HFA 108 (90 BASE) MCG/ACT inhaler Inhale 2 puffs into the lungs every 4 (four) hours as needed for wheezing or shortness of breath.  06/27/13  Yes Historical Provider, MD  lisinopril (PRINIVIL,ZESTRIL) 40 MG tablet Take 1 tablet (  40 mg total) by mouth daily. 08/27/15   Junius Creamer, NP  LORazepam (ATIVAN) 1 MG tablet Take 1 tablet (1 mg total) by mouth every 6 (six) hours as needed for anxiety (use for 3 days at every 6 hours than 3 days every 8 hours than 2 days twice a day). 08/27/15   Junius Creamer, NP  ondansetron (ZOFRAN ODT) 4 MG disintegrating tablet Take 1 tablet (4 mg total) by mouth every 8 (eight) hours as needed for nausea or vomiting. 08/27/15   Junius Creamer, NP   BP 137/82 mmHg  Pulse 89  Temp(Src) 98.8 F (37.1 C) (Oral)  Resp 16  SpO2 97%   Physical Exam  Constitutional: She is oriented to person, place, and time. She appears well-developed and well-nourished. No distress.  HENT:  Head: Normocephalic and  atraumatic.  Eyes: Conjunctivae and EOM are normal.  Neck: Neck supple. No tracheal deviation present.  Cardiovascular: Normal rate, regular rhythm and normal heart sounds.   Pulmonary/Chest: Effort normal. No respiratory distress.  Musculoskeletal: Normal range of motion.  Neurological: She is alert and oriented to person, place, and time.  Skin: Skin is warm and dry.  Psychiatric: She has a normal mood and affect. Her behavior is normal.  Nursing note and vitals reviewed.   ED Course  Procedures (including critical care time) DIAGNOSTIC STUDIES: Oxygen Saturation is 96% on RA, normal by my interpretation.    COORDINATION OF CARE: 10:48 PM-Discussed treatment plan which includes CBC panel and CMP with pt at bedside and pt agreed to plan.     Labs Review Labs Reviewed  COMPREHENSIVE METABOLIC PANEL - Abnormal; Notable for the following:    Glucose, Bld 124 (*)    AST 48 (*)    ALT 66 (*)    All other components within normal limits  CBC WITH DIFFERENTIAL/PLATELET  ETHANOL    Imaging Review No results found.    EKG Interpretation   Date/Time:  Wednesday August 26 2015 18:16:12 EDT Ventricular Rate:  97 PR Interval:    QRS Duration: 82 QT Interval:  345 QTC Calculation: 439 R Axis:   33 Text Interpretation:  Sinus rhythm Probable left atrial enlargement  Confirmed by Alvino Chapel  MD, Ovid Curd [phone]) on 08/27/2015 12:56:16 AM     I feel patients symptoms are a result of ETOH withdrawal as HTN improved after Ativan  Will DC with Rx for short course of Ativan for withdrawal symptoms as patient refuses in [facility] detox program at this time  MDM   Final diagnoses:  Withdrawal symptoms, alcohol, uncomplicated ([place])  Anxiety      I personally performed the services described in this documentation, which was scribed in my presence. The recorded information has been reviewed and is accurate.     Junius Creamer, NP 08/27/15 0125  Junius Creamer, NP 08/27/15  0128  Julianne Rice, MD 08/28/15 [phone]

## 2015-08-26 NOTE — ED Notes (Addendum)
Pt reports HTN with medication. States she's taken her Lisinopril last night 40 mg, 20 mg at noon and 20 mg more at 3 pm and continues to be elevated. Denies chx pain or SOB. A/O at triage NAD.   ADDENDUM: States she consumes 3/4 of liqour daily and has been cutting back, "I am unsure if its withdrawals, I've seen someone for these issues."

## 2015-08-26 NOTE — ED Notes (Addendum)
IV NSS started. Left arm 20g IV placed.Pt tolerated well. NSS infusing at 125cc/hr. Blood work drawn by Designer, multimedia. CIWA is a 3.

## 2015-08-27 DIAGNOSIS — F10230 Alcohol dependence with withdrawal, uncomplicated: Secondary | ICD-10-CM | POA: Diagnosis not present

## 2015-08-27 LAB — COMPREHENSIVE METABOLIC PANEL
ALT: 66 U/L — AB (ref 14–54)
AST: 48 U/L — ABNORMAL HIGH (ref 15–41)
Albumin: 4.1 g/dL (ref 3.5–5.0)
Alkaline Phosphatase: 70 U/L (ref 38–126)
Anion gap: 10 (ref 5–15)
BUN: 13 mg/dL (ref 6–20)
CALCIUM: 9.7 mg/dL (ref 8.9–10.3)
CHLORIDE: 101 mmol/L (ref 101–111)
CO2: 27 mmol/L (ref 22–32)
CREATININE: 0.56 mg/dL (ref 0.44–1.00)
GFR calc non Af Amer: >60 mL/min (ref >60)
Glucose, Bld: 124 mg/dL — ABNORMAL HIGH (ref 65–99)
POTASSIUM: 3.9 mmol/L (ref 3.5–5.1)
SODIUM: 138 mmol/L (ref 135–145)
TOTAL PROTEIN: 7.1 g/dL (ref 6.5–8.1)
Total Bilirubin: 1 mg/dL (ref 0.3–1.2)

## 2015-08-27 LAB — ETHANOL

## 2015-08-27 MED ORDER — ONDANSETRON 4 MG PO TBDP: 4.0000 mg | ORAL_TABLET | Freq: Three times a day (TID) | ORAL | Status: DC | PRN

## 2015-08-27 MED ORDER — LISINOPRIL 40 MG PO TABS: 40.0000 mg | ORAL_TABLET | Freq: Every day | ORAL | Status: DC

## 2015-08-27 MED ORDER — LORAZEPAM 1 MG PO TABS: 1.0000 mg | ORAL_TABLET | Freq: Four times a day (QID) | ORAL | Status: DC | PRN

## 2015-08-27 MED ORDER — LORAZEPAM 1 MG PO TABS
1.0000 mg | ORAL_TABLET | Freq: Once | ORAL | Status: AC
  Administered 2015-08-27: 1 mg via ORAL
  Filled 2015-08-27: qty 1

## 2015-08-27 NOTE — Discharge Instructions (Signed)
Please take the ATIVAN on a tapering basis as prescribed Keep taking your Lisinopril daily You have also been give a prescription for Zofran to help control nausea during withdrawal Make an appointment with your PCP for next week   [facility] The [employer] 211 is a great source of information about community services available.  Access by dialing 2-1-1 from anywhere in [place], or by website -  CustodianSupply.fi.   Other Local Resources (Updated 02/2015)  [facility]  Crisis Hotline, available 24 hours a day, 7 days a week: [place], [place]   [facility]  Crisis Hotline, available 24 hours a day, 7 days a week: [place], [place]  [facility]  Suicide Prevention Hotline, available 24 hours a day, 7 days a week: [place], [place], available 24 hours a day, 7 days a week: [place], [place] Access to [employer], available 24 hours a day, 7 days a week: [phone] All   [facility]  Crisis Hotline, available 24 hours a day, 7 days a week: (270)594-6234 All   Other Local Resources (Updated 02/2015)  Outpatient Counseling/ Substance Abuse Programs  Services     Address and Phone Number  ADS ([facility]cohol [facility] Drug Services)   Options include Individual counseling, group counseling, intensive outpatient program (several hours a day, several days a week)  Offers depression assessments  Provides methadone maintenance program [phone] [address] [place], [place] partial hospitalization/day treatment and DUI/DWI programs  [employer], private insurance [phone] [address], [phone] [place], [place] [address]  [place] include intensive outpatient program (several hours a day, several days a week), outpatient treatment, DUI/DWI services, family education  Also has some services specifically for [employer] transitional housing  [phone] [address] [place], [place] [address]     [place] Medicare, private pay, and private insurance [phone] [address] [place], [place] [address]  [facility]  Services include individual counseling, substance abuse intensive outpatient program (several hours a day, several days a week), day treatment  Blinda [place], Medicaid, private insurance [phone] [address], [place], [place] [address]  [facility]   Offers substance abuse intensive outpatient program (several hours a day, several days a week), partial hospitalization program [phone] [address] [place], [place] [address]  [phone] [address] [place], [place] [address]  [phone] [place], [place] [address]  [phone], [place], [place] [address]  [facility]  Individual counseling only  Accepts private insurance only [phone] [address] [place], [place] [address]  [facility]  Methadone maintenance program [phone] [address] [place], [place] [address]  [facility] providing substance abuse and mental health counseling  Accepts Medicaid, Medicare, private insurance  Offers sliding scale for uninsured [phone] [place], [place] in [place] individual counseling, and intensive in-home services [phone] [address] [place], [place] [address]  [facility] individual counseling, family counseling, group  therapy, domestic violence counseling,  consumer credit counseling  Accepts Medicare, Medicaid, private insurance  Offers sliding scale for uninsured [phone] [address] [place], [place] [address]  [phone] [facility], [address] [place], [place]  [facility]  Offers individual, family and group counseling  3 locations - [place], [place], and [place]  [address] [place], [place] [address]  [address] [place], [place] [address]  [place], [place] [address]  [facility]    Offers psychiatric assessment, 8-week Intensive Outpatient Program (several hours a day, several times a week, daytime or evenings), early recovery group, family Program, medication management  Private pay or private insurance only [phone], or  [phone] [address] [place], [place] [address]  [facility]  Offers individual, couples and family counseling  Accepts Medicaid, private insurance, and sliding scale for uninsured [phone] [address] [place], [place] [address]  Launa Flight, MD  Individual counseling  Private insurance [phone] [place], [place] [address]  [facility]   Offers assessment, substance abuse treatment, and behavioral health treatment [phone] [address] [place], [place] [address]  [place]  Individual counseling  Accepts private insurance [phone] [place], [place] [address]  [facility]  Individual counseling  Blinda [place]ather[place], private insurance [phone] [place], [place] [address]  [facility]    Offers intensive outpatient program (several hours a day, several times a week)  Private pay, private insurance [phone] [place], [place]  Individual  counseling  Medicare, private insurance [phone] [address], [place], [place] [address]  [place]    Offers intensive outpatient program (several hours a day, several times a week) and partial hospitalization program [phone] [place], [place] [address]  Letta Moynahan, MD  Individual counseling [phone] [address], [place], [place] [address]  [place] counseling to individuals, couples, and families  Accepts Medicare and private insurance; offers sliding scale for uninsured [phone] [place], [place] [address]  [facility]  Christian counseling [phone] [address], [place], [place] [address]  [facility] [employer] crisis counseling, individual counseling, group therapy, in-home therapy, domestic violence services, day treatment, DWI services, Conservation officer, nature (CST), Assertive Community Treatment Team (ACTT), substance abuse Intensive Outpatient Program (several hours a day, several times a week)  2 locations - [place] and [place], [place] [address]  [phone] [address] [place], [place] [address]  [facility] counseling and group therapy  [place] insurance, [place], [place] [phone] [address] [place], [place]  [facility]  Offers individual and family counseling  Offers LGBTQ services  Accepts private insurance and private pay [phone] [place], [place] [address]  [facility]    Offers individual counseling, group therapy, and outpatient detox  Accepts private insurance [phone] [place], [place] Medicare, private insurance  [phone] [address] [place], [place] [address]  [employer]  Individual counseling  Accepts Medicare, private insurance [phone] [address],  [address]  [facility]   Offers substance abuse Intensive Outpatient Program (several hours a day, several times a week) [phone],  or [phone] [facility], [place]  Alcohol Withdrawal Alcohol withdrawal is a group of symptoms that can develop when a person who drinks heavily and regularly stops drinking or drinks less. CAUSES Heavy and regular drinking can cause chemicals that send signals from the brain to the body (neurotransmitters) to deactivate. Alcohol withdrawal develops when deactivated neurotransmitters reactivate because a person stops drinking or drinks less. RISK FACTORS The more a person drinks and the longer he or she drinks, the greater the risk of alcohol withdrawal. Severe withdrawal is more likely to develop in someone who:  Had severe alcohol withdrawal in the past.  Had a seizure during a previous episode of alcohol withdrawal.  Is elderly.  Is pregnant.  Has been abusing drugs.  Has other medical problems, including:  Infection.  Heart, lung, or liver disease.  Seizures.  Mental health problems. SYMPTOMS Symptoms of this condition can be mild to moderate, or they can be severe. Mild to moderate symptoms may include:  Fatigue.  Nightmares.  Trouble sleeping.  Depression.  Anxiety.  Inability to think clearly.  Mood swings.  Irritability.  Loss of appetite.  Nausea or vomiting.  Clammy skin.  Extreme sweating.  Rapid heartbeat.  Shakiness.  Uncontrollable shaking (tremor). Severe symptoms may include:  Fever.  Seizures.  Severeconfusion.  Feeling or seeing things that are not there (hallucinations). Symptoms usually begin within eight hours after a person stops drinking or drinks less. They can last for  weeks. DIAGNOSIS Alcohol withdrawal is diagnosed with a medical history and physical exam. Sometimes, urine and blood tests are also done. TREATMENT Treatment may involve:  Monitoring blood pressure, pulse, and breathing.  Getting fluids through an IV tube.  Medicine to reduce anxiety.  Medicine to prevent or control seizures.  Multivitamins and B vitamins.  Having a health care provider check on you daily. If symptoms are moderate to severe or if there is a risk of severe withdrawal, treatment may be done at a hospital or treatment center. HOME CARE INSTRUCTIONS  Take medicines and vitamin supplements only as directed by your health care provider.  Do not drink alcohol.  Have someone stay with you or be available if you need help.  Drink enough fluid to keep your urine clear or pale yellow.  Consider joining a 12-step program or another alcohol support group. SEEK MEDICAL CARE IF:  Your symptoms get worse or do not go away.  You cannot keep food or water in your stomach.  You are struggling with not drinking alcohol.  You cannot stop drinking alcohol. SEEK IMMEDIATE MEDICAL CARE IF:   You have an irregular heartbeat.  You have chest pain.  You have trouble breathing.  You have symptoms of severe withdrawal, such as:  A fever.  Seizures.  Severe confusion.  Hallucinations.   This information is not intended to replace advice given to you by your health care provider. Make sure you discuss any questions you have with your health care provider.   Document Released: 11/10/2004 Document Revised: 02/21/2014 Document Reviewed: 11/19/2013 Elsevier Interactive Patient Education [employer].

## 2015-08-27 NOTE — ED Notes (Signed)
Patient d/c'd self care.  F/U and medications reviewed.  Patient verbalized understanding. 

## 2015-11-06 ENCOUNTER — Encounter: Payer: Self-pay | Admitting: Podiatry

## 2015-11-06 ENCOUNTER — Ambulatory Visit (INDEPENDENT_AMBULATORY_CARE_PROVIDER_SITE_OTHER): Payer: Medicare Other | Admitting: Podiatry

## 2015-11-06 DIAGNOSIS — G5762 Lesion of plantar nerve, left lower limb: Secondary | ICD-10-CM

## 2015-11-12 DIAGNOSIS — G5762 Lesion of plantar nerve, left lower limb: Secondary | ICD-10-CM | POA: Insufficient documentation

## 2015-11-12 NOTE — Progress Notes (Signed)
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 She presents today requesting another alcohol sclerosing injection. She states that she gets about 1 year which helps. She states the pain is the exact same as what was previously. Denies any recent injury or trauma. She is getting numbness and tingling to the second third toes. No other treatment. Denies any systemic complaints such as fevers, chills, nausea, vomiting. No acute changes since last appointment, and no other complaints at this time.   Objective: AAO x3, NAD DP/PT pulses palpable bilaterally, CRT less than 3 seconds There is tenderness of left second interspace upon palpation subjectively there is numbness and tingling the second third toes. Subjective this is the same pain that she had previous sleep. There is no area pinpoint bony tenderness or pain the vibratory sensation. No open lesions or pre-ulcerative lesions.  No pain with calf compression, swelling, warmth, erythema  Assessment: Neuroma left foot second interspace  Plan: -All treatment options discussed with the patient including all alternatives, risks, complications.  -At this time she is requesting an alcohol sclerosing injections into the area. Under sterile conditions a call sclerosing injections infiltrated into the second interspace the left foot at the area of maximal tenderness without complications. Post injection care was discussed. -Follow-up as needed. -Patient encouraged to call the office with any questions, concerns, change in symptoms.   Celesta Gentile, DPM

## 2015-12-04 ENCOUNTER — Ambulatory Visit: Payer: Medicare Other | Admitting: Podiatry

## 2015-12-07 ENCOUNTER — Encounter: Payer: Self-pay | Admitting: Podiatry

## 2015-12-07 ENCOUNTER — Ambulatory Visit (INDEPENDENT_AMBULATORY_CARE_PROVIDER_SITE_OTHER): Payer: Medicare Other | Admitting: Podiatry

## 2015-12-07 DIAGNOSIS — G5762 Lesion of plantar nerve, left lower limb: Secondary | ICD-10-CM | POA: Diagnosis not present

## 2015-12-08 NOTE — Progress Notes (Signed)
Subjective: 70 year old female presents the office today for follow-up evaluation of neuroma to the left second interspace. She states that her last injection did have some relief in the has improved but she is requesting a second injection today. She still states that she can feel a neuroma when she walks as well as numbness or tingling to the second third toes. Several weeks ago she did twist her foot she has some swelling and bruising to her foot as well as pain and this is resolved. She is able to walk without any pain to the area. Denies any systemic complaints such as fevers, chills, nausea, vomiting. No acute changes since last appointment, and no other complaints at this time.   Objective: AAO x3, NAD DP/PT pulses palpable bilaterally, CRT less than 3 seconds There is mild continued tenderness of left second interspace upon palpation subjectively there is numbness and tingling the second third toes. There is no area pinpoint tenderness. No pain vibratory sensation. There is no pain on the midfoot however subjective this is where she had the pain previously. There is no edema, erythema, increase in warmth. There is no ecchymosis present. No open lesions or pre-ulcerative lesions.  No pain with calf compression, swelling, warmth, erythema  Assessment: Neuroma left foot second interspace  Plan: -All treatment options discussed with the patient including all alternatives, risks, complications.  -Discussed risks and complications of second injection to the area she wishes to proceed. Under sterile conditions alcohol sclerosing injections infiltrated the left second interspace without complications. Post injection care was discussed. -We'll hold off on x-ray of the left foot after sprain as her pain is mostly resolved. However if symptoms continue with a start to recur to call the office and we will obtain x-rays and have further treatment of this. She understands this but she wishes to hold off on  x-ray today as she is doing better.  Celesta Gentile, DPM

## 2019-03-07 ENCOUNTER — Ambulatory Visit: Payer: Medicare Other | Attending: Internal Medicine

## 2019-03-07 DIAGNOSIS — Z23 Encounter for immunization: Secondary | ICD-10-CM

## 2019-03-07 NOTE — Progress Notes (Signed)
   Covid-19 Vaccination Clinic  Name:  Wanda Dorsey    MRN: [id number] DOB: April 24, 1945  03/07/2019  [name] was observed post Covid-19 immunization for 15 minutes without incidence. She was provided with Vaccine Information Sheet and instruction to access the V-Safe system.   [name] was instructed to call 911 with any severe reactions post vaccine: Marland Kitchen Difficulty breathing  . Swelling of your face and throat  . A fast heartbeat  . A bad rash all over your body  . Dizziness and weakness    Immunizations Administered    Name Date Dose VIS Date Route   Pfizer COVID-19 Vaccine 03/07/2019  2:56 PM 0.3 mL 01/25/2019 Intramuscular   Manufacturer: [place]   Lot: [id number]   [place]: [id number]

## 2019-03-28 ENCOUNTER — Ambulatory Visit: Payer: Medicare Other | Attending: Internal Medicine

## 2019-03-28 DIAGNOSIS — Z23 Encounter for immunization: Secondary | ICD-10-CM | POA: Insufficient documentation

## 2019-03-28 NOTE — Progress Notes (Signed)
   Covid-19 Vaccination Clinic  Name:  Wanda Dorsey    MRN: [id number] DOB: [birth year]  03/28/2019  Wanda Dorsey was observed post Covid-19 immunization for 15 minutes without incidence. She was provided with Vaccine Information Sheet and instruction to access the V-Safe system.   Wanda Dorsey was instructed to call 911 with any severe reactions post vaccine: Marland Kitchen Difficulty breathing  . Swelling of your face and throat  . A fast heartbeat  . A bad rash all over your body  . Dizziness and weakness    Immunizations Administered    Name Date Dose VIS Date Route   Pfizer COVID-19 Vaccine 03/28/2019  3:44 PM 0.3 mL 01/25/2019 Intramuscular   Manufacturer: [place]   Lot: [id number]   [place]: [id number]

## 2019-04-04 ENCOUNTER — Ambulatory Visit: Payer: Medicare Other

## 2019-06-09 ENCOUNTER — Emergency Department (HOSPITAL_COMMUNITY)
Admission: EM | Admit: 2019-06-09 | Discharge: 2019-06-09 | Disposition: A | Discharge status: Home / Self Care | Payer: Medicare Other | Attending: Emergency Medicine | Admitting: Emergency Medicine

## 2019-06-09 ENCOUNTER — Encounter (HOSPITAL_COMMUNITY): Payer: Self-pay

## 2019-06-09 ENCOUNTER — Other Ambulatory Visit: Payer: Self-pay

## 2019-06-09 DIAGNOSIS — Z87891 Personal history of nicotine dependence: Secondary | ICD-10-CM | POA: Insufficient documentation

## 2019-06-09 DIAGNOSIS — I10 Essential (primary) hypertension: Secondary | ICD-10-CM | POA: Diagnosis not present

## 2019-06-09 DIAGNOSIS — R040 Epistaxis: Secondary | ICD-10-CM | POA: Insufficient documentation

## 2019-06-09 DIAGNOSIS — Z79899 Other long term (current) drug therapy: Secondary | ICD-10-CM | POA: Insufficient documentation

## 2019-06-09 DIAGNOSIS — Z853 Personal history of malignant neoplasm of breast: Secondary | ICD-10-CM | POA: Insufficient documentation

## 2019-06-09 MED ORDER — OXYMETAZOLINE HCL 0.05 % NA SOLN
1.0000 | Freq: Once | NASAL | Status: AC
  Administered 2019-06-09: 1 via NASAL
  Filled 2019-06-09: qty 30

## 2019-06-09 NOTE — ED Notes (Signed)
Patient provided with turkey sandwich and water

## 2019-06-09 NOTE — ED Provider Notes (Signed)
[facility] DEPT Provider Note   CSN: [id number] Arrival date & time: 06/09/19  1435     History Chief Complaint  Patient presents with  . Epistaxis    Wanda Dorsey is a 74 y.o. female hx of HTN, here presenting with epistaxis. Patient states that around 1 PM, she was doing her taxes and had sudden onset of nosebleed on the right nostril.  She tried to put some pressure on it but it did not stop.  Patient then called EMS and they put some Afrin and applied pressure for about an hour, but it did not stop.  Patient states that she is not on any blood thinners.  The history is provided by the patient.       Past Medical History:  Diagnosis Date  . Cancer ([place])   . Hearing loss   . Hypertension     Patient Active Problem List   Diagnosis Date Noted  . Neuroma of second interspace of left foot 11/12/2015  . Breast cancer ([place]) 05/08/2011    Past Surgical History:  Procedure Laterality Date  . BREAST SURGERY    . CESAREAN SECTION       OB History   No obstetric history on file.     History reviewed. No pertinent family history.  Social History   Tobacco Use  . Smoking status: Former Research scientist (life sciences)  . Smokeless tobacco: Never Used  Substance Use Topics  . Alcohol use: Yes    Comment: 80 proff vodka approx 20 oz daily   . Drug use: No    Home Medications Prior to Admission medications   Medication Sig Start Date End Date Taking? Authorizing Provider  anastrozole (ARIMIDEX) 1 MG tablet Take 1 mg by mouth daily.  04/10/11   [provider]  B Complex-C (SUPER B COMPLEX PO) Take 1 tablet by mouth daily.     [provider]  cetirizine (ZYRTEC) 10 MG tablet Take 10 mg by mouth daily as needed for allergies.    [provider]  cholecalciferol (VITAMIN D) 1000 UNITS tablet Take 1,000 Units by mouth daily.    [provider]  lisinopril (PRINIVIL,ZESTRIL) 40 MG tablet Take 1 tablet (40 mg total) by mouth  daily. 08/27/15   Junius Creamer, NP  LORazepam (ATIVAN) 1 MG tablet Take 1 tablet (1 mg total) by mouth every 6 (six) hours as needed for anxiety (use for 3 days at every 6 hours than 3 days every 8 hours than 2 days twice a day). 08/27/15   Junius Creamer, NP  omeprazole (PRILOSEC) 20 MG capsule Take 20 mg by mouth daily.    [provider]  ondansetron (ZOFRAN ODT) 4 MG disintegrating tablet Take 1 tablet (4 mg total) by mouth every 8 (eight) hours as needed for nausea or vomiting. 08/27/15   Junius Creamer, NP  PROAIR HFA 108 (90 BASE) MCG/ACT inhaler Inhale 2 puffs into the lungs every 4 (four) hours as needed for wheezing or shortness of breath.  06/27/13   [provider]    Allergies    Other and Penicillins  Review of Systems   Review of Systems  HENT: Positive for nosebleeds.   All other systems reviewed and are negative.   Physical Exam Updated Vital Signs BP (!) 153/99 (BP Location: Left Arm)   Pulse 80   Temp 97.8 F (36.6 C) (Oral)   Resp 18   Ht 5\' 2"  (1.575 m)   Wt 65.8 kg  SpO2 96%   BMI 26.52 kg/m   Physical Exam Vitals and nursing note reviewed.  Constitutional:      Appearance: Normal appearance.  HENT:     Head: Normocephalic.     Right Ear: Tympanic membrane normal.     Left Ear: Tympanic membrane normal.     Nose:     Comments: Blood in the right nostril.  There appeared to be some active bleeding under the inferior turbinate as well as the septum.  There is no active bleeding in the left nostril.  No bleeding in the posterior pharynx.    Mouth/Throat:     Mouth: Mucous membranes are dry.  Eyes:     Extraocular Movements: Extraocular movements intact.     Pupils: Pupils are equal, round, and reactive to light.  Cardiovascular:     Rate and Rhythm: Normal rate and regular rhythm.     Pulses: Normal pulses.  Pulmonary:     Effort: Pulmonary effort is normal.     Breath sounds: Normal breath sounds.  Abdominal:     General: Abdomen is  flat.     Palpations: Abdomen is soft.  Musculoskeletal:        General: Normal range of motion.     Cervical back: Normal range of motion.  Skin:    General: Skin is warm.     Capillary Refill: Capillary refill takes less than 2 seconds.  Neurological:     General: No focal deficit present.     Mental Status: She is alert.  Psychiatric:        Mood and Affect: Mood normal.        Behavior: Behavior normal.     ED Results / Procedures / Treatments   Labs (all labs ordered are listed, but only abnormal results are displayed) Labs Reviewed - No data to display  EKG None  Radiology No results found.  Procedures .Epistaxis Management  Date/Time: 06/09/2019 4:58 PM Performed by: Drenda Freeze, MD Authorized by: Drenda Freeze, MD   Consent:    Consent obtained:  Verbal   Consent given by:  Patient   Risks discussed:  Bleeding   Alternatives discussed:  No treatment Procedure details:    Treatment site:  R anterior   Treatment method:  Silver nitrate and gel foam   Treatment complexity:  Limited   Treatment episode: initial   Post-procedure details:    Patient tolerance of procedure:  Tolerated well, no immediate complications   (including critical care time)  .  Medications Ordered in ED Medications  oxymetazoline (AFRIN) 0.05 % nasal spray 1 spray (has no administration in time range)    ED Course  I have reviewed the triage vital signs and the nursing notes.  Pertinent labs & imaging results that were available during my care of the patient were reviewed by me and considered in my medical decision making (see chart for details).    MDM Rules/Calculators/A&P                      Wanda Dorsey is a 74 y.o. female who presenting with epistaxis.  Patient has bleeding on the right nostril.  I was able to clean out all clot and found a bleeding source right underneath the inferior turbinate.  I tried some Surgicel and silver nitrate.  The bleeding has  stopped and patient tolerated p.o. in the ED.  Stable for discharge and patient can use Afrin as needed.  Final Clinical Impression(s) / ED Diagnoses Final diagnoses:  None    Rx / DC Orders ED Discharge Orders    None       Drenda Freeze, MD 06/09/19 1659

## 2019-06-09 NOTE — ED Triage Notes (Signed)
Per EMS, Pt called out from home, complaining of a nosebleed that started apprx 1-2 hrs ago. EMS unable to stop bleeding, no blood thinners, and denies any trauma to the area.

## 2019-06-09 NOTE — ED Notes (Signed)
Pt nose still not bleeding at this time.

## 2019-06-09 NOTE — ED Notes (Signed)
Pt able to eat and drink with no issues, and no bleeding at this time. Dr. Darl Householder updated.

## 2019-06-09 NOTE — Discharge Instructions (Signed)
You may use Afrin as needed for nosebleed.  I was able to stop the bleeding right now.  The Surgicel will dissolve by itself.  If you have to sneeze, please sneeze with your mouth open.  Follow-up with your doctor.  Return to ER if you have uncontrolled bleeding, vomiting blood or coughing up blood.

## 2023-01-14 ENCOUNTER — Emergency Department (HOSPITAL_BASED_OUTPATIENT_CLINIC_OR_DEPARTMENT_OTHER): Payer: Medicare Other

## 2023-01-14 ENCOUNTER — Emergency Department (HOSPITAL_BASED_OUTPATIENT_CLINIC_OR_DEPARTMENT_OTHER)
Admission: EM | Admit: 2023-01-14 | Discharge: 2023-01-14 | Disposition: A | Discharge status: Home / Self Care | Payer: Medicare Other | Attending: Emergency Medicine | Admitting: Emergency Medicine

## 2023-01-14 ENCOUNTER — Telehealth (HOSPITAL_BASED_OUTPATIENT_CLINIC_OR_DEPARTMENT_OTHER): Payer: Self-pay | Admitting: Emergency Medicine

## 2023-01-14 DIAGNOSIS — R0602 Shortness of breath: Secondary | ICD-10-CM

## 2023-01-14 DIAGNOSIS — R718 Other abnormality of red blood cells: Secondary | ICD-10-CM | POA: Insufficient documentation

## 2023-01-14 DIAGNOSIS — I10 Essential (primary) hypertension: Secondary | ICD-10-CM | POA: Diagnosis not present

## 2023-01-14 DIAGNOSIS — D72829 Elevated white blood cell count, unspecified: Secondary | ICD-10-CM | POA: Insufficient documentation

## 2023-01-14 DIAGNOSIS — Z20822 Contact with and (suspected) exposure to covid-19: Secondary | ICD-10-CM | POA: Diagnosis not present

## 2023-01-14 DIAGNOSIS — R197 Diarrhea, unspecified: Secondary | ICD-10-CM | POA: Diagnosis not present

## 2023-01-14 DIAGNOSIS — R051 Acute cough: Secondary | ICD-10-CM | POA: Insufficient documentation

## 2023-01-14 DIAGNOSIS — J45909 Unspecified asthma, uncomplicated: Secondary | ICD-10-CM | POA: Diagnosis not present

## 2023-01-14 DIAGNOSIS — Z79899 Other long term (current) drug therapy: Secondary | ICD-10-CM | POA: Diagnosis not present

## 2023-01-14 LAB — RESP PANEL BY RT-PCR (RSV, FLU A&B, COVID)  RVPGX2
Influenza A by PCR: NEGATIVE
Influenza B by PCR: NEGATIVE
Resp Syncytial Virus by PCR: NEGATIVE
SARS Coronavirus 2 by RT PCR: NEGATIVE

## 2023-01-14 LAB — BASIC METABOLIC PANEL
Anion gap: 14 (ref 5–15)
BUN: 12 mg/dL (ref 8–23)
CO2: 26 mmol/L (ref 22–32)
Calcium: 9.7 mg/dL (ref 8.9–10.3)
Chloride: 98 mmol/L (ref 98–111)
Creatinine, Ser: 0.5 mg/dL (ref 0.44–1.00)
GFR, Estimated: >60 mL/min (ref >60)
Glucose, Bld: 124 mg/dL — ABNORMAL HIGH (ref 70–99)
Potassium: 3.8 mmol/L (ref 3.5–5.1)
Sodium: 138 mmol/L (ref 135–145)

## 2023-01-14 LAB — CBC WITH DIFFERENTIAL/PLATELET
Abs Immature Granulocytes: 0.04 10*3/uL (ref 0.00–0.07)
Basophils Absolute: 0.1 10*3/uL (ref 0.0–0.1)
Basophils Relative: 1 %
Eosinophils Absolute: 0 10*3/uL (ref 0.0–0.5)
Eosinophils Relative: 0 %
HCT: 47.2 % — ABNORMAL HIGH (ref 36.0–46.0)
Hemoglobin: 16 g/dL — ABNORMAL HIGH (ref 12.0–15.0)
Immature Granulocytes: 0 %
Lymphocytes Relative: 22 %
Lymphs Abs: 2.5 10*3/uL (ref 0.7–4.0)
MCH: 32.3 pg (ref 26.0–34.0)
MCHC: 33.9 g/dL (ref 30.0–36.0)
MCV: 95.2 fL (ref 80.0–100.0)
Monocytes Absolute: 0.7 10*3/uL (ref 0.1–1.0)
Monocytes Relative: 7 %
Neutro Abs: 7.9 10*3/uL — ABNORMAL HIGH (ref 1.7–7.7)
Neutrophils Relative %: 70 %
Platelets: 179 10*3/uL (ref 150–400)
RBC: 4.96 MIL/uL (ref 3.87–5.11)
RDW: 14.2 % (ref 11.5–15.5)
WBC: 11.3 10*3/uL — ABNORMAL HIGH (ref 4.0–10.5)
nRBC: 0 % (ref 0.0–0.2)

## 2023-01-14 LAB — BRAIN NATRIURETIC PEPTIDE: B Natriuretic Peptide: 111 pg/mL — ABNORMAL HIGH (ref 0.0–100.0)

## 2023-01-14 MED ORDER — DIAZEPAM 5 MG PO TABS: ORAL_TABLET | ORAL | 0 refills | Status: DC

## 2023-01-14 MED ORDER — METOPROLOL TARTRATE 25 MG PO TABS
100.0000 mg | ORAL_TABLET | Freq: Once | ORAL | Status: AC
  Administered 2023-01-14: 100 mg via ORAL
  Filled 2023-01-14: qty 4

## 2023-01-14 MED ORDER — DEXAMETHASONE SODIUM PHOSPHATE 10 MG/ML IJ SOLN
10.0000 mg | Freq: Once | INTRAMUSCULAR | Status: AC
  Administered 2023-01-14: 10 mg via INTRAVENOUS
  Filled 2023-01-14: qty 1

## 2023-01-14 MED ORDER — ALBUTEROL SULFATE HFA 108 (90 BASE) MCG/ACT IN AERS: 2.0000 | INHALATION_SPRAY | RESPIRATORY_TRACT | Status: DC | PRN

## 2023-01-14 MED ORDER — IOHEXOL 350 MG/ML SOLN
75.0000 mL | Freq: Once | INTRAVENOUS | Status: AC | PRN
  Administered 2023-01-14: 75 mL via INTRAVENOUS

## 2023-01-14 MED ORDER — LISINOPRIL 10 MG PO TABS
20.0000 mg | ORAL_TABLET | Freq: Once | ORAL | Status: AC
  Administered 2023-01-14: 20 mg via ORAL
  Filled 2023-01-14: qty 2

## 2023-01-14 MED ORDER — BENZONATATE 100 MG PO CAPS
100.0000 mg | ORAL_CAPSULE | Freq: Three times a day (TID) | ORAL | 0 refills | Status: DC
Start: 2023-01-14 — End: 2023-04-18

## 2023-01-14 MED ORDER — DIAZEPAM 5 MG PO TABS: ORAL_TABLET | ORAL | 0 refills | Status: AC

## 2023-01-14 MED ORDER — CLONIDINE HCL 0.1 MG PO TABS
0.2000 mg | ORAL_TABLET | Freq: Once | ORAL | Status: AC
  Administered 2023-01-14: 0.2 mg via ORAL
  Filled 2023-01-14: qty 2

## 2023-01-14 NOTE — Discharge Instructions (Addendum)
Please read and follow all provided instructions.  Your diagnoses today include:  1. Acute cough   2. Shortness of breath     Tests performed today include: Blood cell counts and electrolytes Test for heart failure: Was not significant elevated Chest x-ray and CT scan of the chest: Fortunately did not signs or signs of pneumonia or blood clots Flu, COVID, RSV testing was negative Vital signs. See below for your results today.   Medications prescribed:  Valium to treat alcohol withdrawal, do not combine this medication with alcohol Tessalon Perles - cough suppressant medication   Take any prescribed medications only as directed.  Home care instructions:  Follow any educational materials contained in this packet.  BE VERY CAREFUL not to take multiple medicines containing Tylenol (also called acetaminophen). Doing so can lead to an overdose which can damage your liver and cause liver failure and possibly death.   Follow-up instructions: Please follow-up with your primary care provider in the next 3 days for further evaluation of your symptoms.   Return instructions:  Please return to the Emergency Department if you experience worsening symptoms.  Please return if you have any other emergent concerns.  Additional Information:  Your vital signs today were: BP (!) 151/96   Pulse 71   Temp 97.8 F (36.6 C) (Oral)   Resp 15   SpO2 95%  If your blood pressure (BP) was elevated above 135/85 this visit, please have this repeated by your doctor within one month. --------------

## 2023-01-14 NOTE — ED Triage Notes (Signed)
Pt c/o SOB and chest tightness for 2 weeks, along with "barking cough."   Pt h/o alcoholism with recent relapse.  States she has been drinking "a lot" for past many weeks and has had a few sips this morning.  States he asthma acts up when she drinks. Denies any h/o seizure with withdrawal, but that he PMD has prescribed her ativan to take whenever she stops drinking.

## 2023-01-14 NOTE — Telephone Encounter (Cosign Needed)
Pharmacy called needing correction of tablets for valium RX. Changed from 10 to 20.

## 2023-01-14 NOTE — ED Provider Notes (Signed)
[place] EMERGENCY DEPARTMENT AT [facility] Provider Note   CSN: [id number] Arrival date & time: 01/14/23  8469     History  Chief Complaint  Patient presents with   Shortness of Breath    Wanda Dorsey is a 77 y.o. female.  Patient with history of alcohol abuse, hypertension currently on lisinopril and metoprolol, asthma -- presents to the emergency department today for evaluation of of cough and shortness of breath.  This has been ongoing for about 2 weeks.  Patient has been around several family members who have had similar respiratory illnesses.  She reports a "barking cough" and hoarse voice at times.  She has had nausea without vomiting.  She has had some diarrhea today.  She does report recent relapse with alcohol abuse.  Last drink was this morning.  She typically takes her blood pressure medicine around noon and has not taken it today.       Home Medications Prior to Admission medications   Medication Sig Start Date End Date Taking? Authorizing Provider  anastrozole (ARIMIDEX) 1 MG tablet Take 1 mg by mouth daily.  04/10/11   [provider]  cetirizine (ZYRTEC) 10 MG tablet Take 10 mg by mouth daily as needed for allergies.    [provider]  cholecalciferol (VITAMIN D) 1000 UNITS tablet Take 1,000 Units by mouth daily.    [provider]  lisinopril (PRINIVIL,ZESTRIL) 40 MG tablet Take 1 tablet (40 mg total) by mouth daily. Patient not taking: Reported on 06/09/2019 08/27/15   Earley Favor, NP  lisinopril (ZESTRIL) 20 MG tablet Take 20 mg by mouth daily. 05/30/19   [provider]  LORazepam (ATIVAN) 0.5 MG tablet Take 0.5 mg by mouth 3 (three) times daily as needed for anxiety.  05/30/19   [provider]  LORazepam (ATIVAN) 1 MG tablet Take 1 tablet (1 mg total) by mouth every 6 (six) hours as needed for anxiety (use for 3 days at every 6 hours than 3 days every 8 hours than 2 days twice a day). Patient not taking:  Reported on 06/09/2019 08/27/15   Earley Favor, NP  metoprolol tartrate (LOPRESSOR) 50 MG tablet Take 50 mg by mouth 2 (two) times daily. 02/05/19   [provider]  naproxen sodium (ALEVE) 220 MG tablet Take 220 mg by mouth 2 (two) times daily as needed (pain).    [provider]  omeprazole (PRILOSEC) 20 MG capsule Take 20 mg by mouth daily.    [provider]  ondansetron (ZOFRAN ODT) 4 MG disintegrating tablet Take 1 tablet (4 mg total) by mouth every 8 (eight) hours as needed for nausea or vomiting. Patient not taking: Reported on 06/09/2019 08/27/15   Earley Favor, NP  PROAIR RESPICLICK [phone] Base) MCG/ACT AEPB Take 2 puffs by mouth 4 (four) times daily as needed (sob/wheezing).  05/03/19   [provider]      Allergies    Other and Penicillins    Review of Systems   Review of Systems  Physical Exam Updated Vital Signs BP (!) 212/132 (BP Location: Right Arm)   Pulse 98   Temp 97.8 F (36.6 C) (Oral)   Resp 19   SpO2 97%   Physical Exam Vitals and nursing note reviewed.  Constitutional:      General: She is not in acute distress.    Appearance: She is well-developed.  HENT:     Head: Normocephalic and atraumatic.     Right Ear: External ear  normal.     Left Ear: External ear normal.     Nose: Nose normal.     Mouth/Throat:     Mouth: Mucous membranes are moist.     Pharynx: No pharyngeal swelling or oropharyngeal exudate.  Eyes:     Conjunctiva/sclera: Conjunctivae normal.  Cardiovascular:     Rate and Rhythm: Normal rate and regular rhythm.     Heart sounds: No murmur heard. Pulmonary:     Effort: No respiratory distress.     Breath sounds: No wheezing, rhonchi or rales.     Comments: Patient speaking in full sentences, no distress. Abdominal:     Palpations: Abdomen is soft.     Tenderness: There is no abdominal tenderness. There is no guarding or rebound.  Musculoskeletal:     Cervical back: Normal range of motion and neck  supple.     Right lower leg: No edema.     Left lower leg: No edema.  Skin:    General: Skin is warm and dry.     Findings: No rash.  Neurological:     General: No focal deficit present.     Mental Status: She is alert. Mental status is at baseline.     Motor: No weakness.  Psychiatric:        Mood and Affect: Mood normal.     ED Results / Procedures / Treatments   Labs (all labs ordered are listed, but only abnormal results are displayed) Labs Reviewed  CBC WITH DIFFERENTIAL/PLATELET - Abnormal; Notable for the following components:      Result Value   WBC 11.3 (*)    Hemoglobin 16.0 (*)    HCT 47.2 (*)    Neutro Abs 7.9 (*)    All other components within normal limits  BASIC METABOLIC PANEL - Abnormal; Notable for the following components:   Glucose, Bld 124 (*)    All other components within normal limits  BRAIN NATRIURETIC PEPTIDE - Abnormal; Notable for the following components:   B Natriuretic Peptide 111.0 (*)    All other components within normal limits  RESP PANEL BY RT-PCR (RSV, FLU A&B, COVID)  [id number]    EKG EKG Interpretation Date/Time:  Saturday January 14 2023 09:18:02 EST Ventricular Rate:  96 PR Interval:  151 QRS Duration:  76 QT Interval:  338 QTC Calculation: 423 R Axis:   4  Text Interpretation: Sinus rhythm Left atrial enlargement Left ventricular hypertrophy Anterior Q waves, possibly due to LVH Confirmed by Vonita Moss [phone]) on 01/14/2023 9:25:31 AM  Radiology DG Chest Port 1 View  Result Date: 01/14/2023 CLINICAL DATA:  Shortness of breath. EXAM: PORTABLE CHEST 1 VIEW COMPARISON:  04/23/2008 chest CT FINDINGS: Low volume chest with eventration appearance of the right diaphragm. Normal heart size and mediastinal contours for technique. Calcific densities in the line and oriented along anterior right third through fifth ribs, likely healing fractures. Prior right axillary dissection and mastectomy. There is no edema, air bronchogram,  effusion, or pneumothorax. Some prominence of markings at the left base but not convincing for infiltrate. IMPRESSION: No convincing pneumonia. Electronically Signed   By: Tiburcio Pea M.D.   On: 01/14/2023 10:05    Procedures Procedures    Medications Ordered in ED Medications  albuterol (VENTOLIN HFA) 108 (90 Base) MCG/ACT inhaler 2 puff (has no administration in time range)  metoprolol tartrate (LOPRESSOR) tablet 100 mg (100 mg Oral Given 01/14/23 0941)  lisinopril (ZESTRIL) tablet 20 mg (20 mg Oral Given 01/14/23 0941)  cloNIDine (CATAPRES) tablet 0.2 mg (0.2 mg Oral Given 01/14/23 1019)  iohexol (OMNIPAQUE) 350 MG/ML injection 75 mL (75 mLs Intravenous Contrast Given 01/14/23 1107)  dexamethasone (DECADRON) injection 10 mg (10 mg Intravenous Given 01/14/23 1226)    ED Course/ Medical Decision Making/ A&P    Patient seen and examined. History obtained directly from patient.   Labs/EKG: Ordered CBC, BMP.  Imaging: Ordered chest x-ray.  Medications/Fluids: Ordered: Home metoprolol and lisinopril due to elevated blood pressure.   Most recent vital signs reviewed and are as follows: BP (!) 212/132 (BP Location: Right Arm)   Pulse 98   Temp 97.8 F (36.6 C) (Oral)   Resp 19   SpO2 97%   Initial impression: Cough, patient with well appearance, no hypoxia, no tachypnea.  Blood pressure is elevated.  Overall low concern for congestive heart failure exacerbation related to uncontrolled hypertension.  10:16 AM Reassessment performed. Patient appears stable.  Labs personally reviewed and interpreted including: CBC with minimally elevated white blood cell count 11.3, hemoglobin slightly elevated at 16; BMP unremarkable; viral panel negative.  Imaging personally visualized and interpreted including: Chest x-ray, agree calcifications, no definite pneumonia.  Most current vital signs reviewed and are as follows: BP (!) 206/126   Pulse 89   Temp 97.8 F (36.6 C) (Oral)   Resp  15   SpO2 91%   Plan: Blood pressure remains elevated.  Patient states that she has had clonidine in the past as needed for elevated blood pressures although she has not taken this in some time.  Will give a dose of clonidine and have patient ambulate to ensure no hypoxia.    Reassessment performed. Patient appears stable.  She had desaturation into the mid 80s with ambulation.  She reports shortness of breath at rest and with ambulation.  Labs: Added BNP  Imaging: Ordered CTA to evaluate for occult pneumonia and to evaluate for PE as possible cause of shortness of breath with hypoxia.  Reviewed pertinent lab work and imaging with patient at bedside. Questions answered.   Plan: Reassess after imaging.   1:03 PM Reassessment performed. Patient appears stable.  Blood pressure much improved with treatment.  Labs personally reviewed and interpreted including: BNP minimally elevated  Imaging personally visualized and interpreted including: CTA of the chest, agree no pneumonia or PE.  Reviewed pertinent lab work and imaging with patient at bedside. Questions answered.   Patient discussed with and seen by Dr. Eloise Harman.  Most current vital signs reviewed and are as follows: BP 136/88   Pulse 68   Temp 97.8 F (36.6 C) (Oral)   Resp 15   SpO2 97%   Plan: Discharge to home.  Will give taper course of Valium per patient request for help with alcohol withdrawal.  Discussed not to take this while drinking alcohol.  Will give a dose of IM dexamethasone here for cough and possible exacerbation of asthma.  Prescriptions written for: Tessalon  Other home care instructions discussed: Rest, hydration  ED return instructions discussed: Worsening shortness of breath, increased work of breathing, fevers  Follow-up instructions discussed: Patient encouraged to follow-up with their PCP in 3 days.                                   Medical Decision Making Amount and/or Complexity of Data  Reviewed Labs: ordered. Radiology: ordered.  Risk Prescription drug management.   Patient with signs and symptoms  of a respiratory infection.  Recent sick contact with family.  No pneumonia on imaging today.  Patient did have some transient hypoxia with ambulation, however she would like to be discharged today.  She looks comfortable at rest.  Lungs without wheezing.  No PE.  Mild elevation white blood cell count otherwise labs are unremarkable.  Patient does request medication to help prevent withdrawal symptoms with cessation of alcohol.  Overall she looks well, nontoxic.  No concern for ACS.  Suspect viral syndrome causing exacerbation of her breathing symptoms.  Uncontrolled hypertension: Improved with oral medications in the ED today.  No concern for endorgan damage.  No evidence of fluid overload on exam, labs, or imaging.  Renal function is normal.  The patient's vital signs, pertinent lab work and imaging were reviewed and interpreted as discussed in the ED course. Hospitalization was considered for further testing, treatments, or serial exams/observation. However as patient is well-appearing, has a stable exam, and reassuring studies today, I do not feel that they warrant admission at this time. This plan was discussed with the patient who verbalizes agreement and comfort with this plan and seems reliable and able to return to the Emergency Department with worsening or changing symptoms.          Final Clinical Impression(s) / ED Diagnoses Final diagnoses:  Acute cough  Shortness of breath    Rx / DC Orders ED Discharge Orders          Ordered    diazepam (VALIUM) 5 MG tablet  Multiple Frequencies        01/14/23 1210    benzonatate (TESSALON) 100 MG capsule  Every 8 hours        01/14/23 1249              Renne Crigler, PA-C 01/14/23 1307    Rondel Baton, MD 01/19/23 1733

## 2023-01-14 NOTE — ED Notes (Signed)
Ms. Spitale ambulated around the nurses' station on room air.  Her O2 sat decreased to 86% and her HR stayed in the 90's.  No shortness of breath noted.

## 2023-02-21 ENCOUNTER — Other Ambulatory Visit: Payer: Self-pay

## 2023-02-21 ENCOUNTER — Emergency Department (HOSPITAL_BASED_OUTPATIENT_CLINIC_OR_DEPARTMENT_OTHER)
Admission: EM | Admit: 2023-02-21 | Discharge: 2023-02-21 | Disposition: A | Discharge status: Home / Self Care | Payer: Medicare Other | Attending: Emergency Medicine | Admitting: Emergency Medicine

## 2023-02-21 ENCOUNTER — Encounter (HOSPITAL_BASED_OUTPATIENT_CLINIC_OR_DEPARTMENT_OTHER): Payer: Self-pay | Admitting: Emergency Medicine

## 2023-02-21 DIAGNOSIS — I10 Essential (primary) hypertension: Secondary | ICD-10-CM | POA: Insufficient documentation

## 2023-02-21 DIAGNOSIS — Z79899 Other long term (current) drug therapy: Secondary | ICD-10-CM | POA: Diagnosis not present

## 2023-02-21 MED ORDER — LORAZEPAM 1 MG PO TABS
1.0000 mg | ORAL_TABLET | Freq: Once | ORAL | Status: AC
  Administered 2023-02-21: 1 mg via ORAL
  Filled 2023-02-21: qty 1

## 2023-02-21 NOTE — ED Provider Notes (Signed)
 [place] EMERGENCY DEPARTMENT AT [facility] Provider Note   CSN: [id number] Arrival date & time: 02/21/23  1146     History  Chief Complaint  Patient presents with   Hypertension    Wanda Dorsey is a 78 y.o. female.  Patient to ED with concern for elevated blood pressure. She is currently doing a supervised outpatient detox from alcohol and on a regimen of clonidine  and ativan  that started 1/3. Her son is monitoring and dispensing medications. Her blood pressure has been persistently elevated on several readings. No chest pain, SOB, LE swelling.   The history is provided by the patient and a relative. No language interpreter was used.  Hypertension       Home Medications Prior to Admission medications   Medication Sig Start Date End Date Taking? Authorizing Provider  anastrozole  (ARIMIDEX ) 1 MG tablet Take 1 mg by mouth daily.  04/10/11   [provider]  benzonatate  (TESSALON ) 100 MG capsule Take 1 capsule (100 mg total) by mouth every 8 (eight) hours. 01/14/23   Geiple, Joshua, PA-C  cetirizine (ZYRTEC) 10 MG tablet Take 10 mg by mouth daily as needed for allergies.    [provider]  cholecalciferol  (VITAMIN D ) 1000 UNITS tablet Take 1,000 Units by mouth daily.    [provider]  lisinopril  (ZESTRIL ) 20 MG tablet Take 20 mg by mouth daily. 05/30/19   [provider]  LORazepam  (ATIVAN ) 0.5 MG tablet Take 0.5 mg by mouth 3 (three) times daily as needed for anxiety.  05/30/19   [provider]  metoprolol  tartrate (LOPRESSOR ) 50 MG tablet Take 50 mg by mouth 2 (two) times daily. 02/05/19   [provider]  naproxen sodium (ALEVE) 220 MG tablet Take 220 mg by mouth 2 (two) times daily as needed (pain).    [provider]  omeprazole (PRILOSEC) 20 MG capsule Take 20 mg by mouth daily.    [provider]  PROAIR  RESPICLICK 108 (90 Base) MCG/ACT AEPB Take 2 puffs by mouth 4 (four) times daily as  needed (sob/wheezing).  05/03/19   [provider]      Allergies    Other and Penicillins    Review of Systems   Review of Systems  Physical Exam Updated Vital Signs BP (!) 155/95 (BP Location: Left Arm)   Pulse 67   Temp 98.7 F (37.1 C)   Resp 17   Ht 5' 2 (1.575 m)   Wt 65.8 kg   SpO2 95%   BMI 26.52 kg/m  Physical Exam Vitals and nursing note reviewed.  Constitutional:      Appearance: She is well-developed.  HENT:     Head: Normocephalic.  Cardiovascular:     Rate and Rhythm: Normal rate and regular rhythm.     Heart sounds: No murmur heard. Pulmonary:     Effort: Pulmonary effort is normal.     Breath sounds: Normal breath sounds. No wheezing, rhonchi or rales.  Abdominal:     General: There is no distension.     Palpations: Abdomen is soft.  Musculoskeletal:        General: Normal range of motion.     Cervical back: Normal range of motion and neck supple.     Right lower leg: No edema.     Left lower leg: No edema.  Skin:    General: Skin is warm and dry.  Neurological:     General: No focal deficit present.     Mental  Status: She is alert and oriented to person, place, and time.     ED Results / Procedures / Treatments   Labs (all labs ordered are listed, but only abnormal results are displayed) Labs Reviewed - No data to display  EKG EKG Interpretation Date/Time:  Tuesday February 21 2023 14:04:44 EST Ventricular Rate:  66 PR Interval:  204 QRS Duration:  82 QT Interval:  425 QTC Calculation: 446 R Axis:   22  Text Interpretation: Sinus rhythm Probable left atrial enlargement Since last tracing rate slower Otherwise no significant change Confirmed by Emil Share [phone]) on 02/21/2023 2:15:57 PM  Radiology No results found.  Procedures Procedures    Medications Ordered in ED Medications  LORazepam  (ATIVAN ) tablet 1 mg (1 mg Oral Given 02/21/23 1351)    ED Course/ Medical Decision Making/ A&P Clinical Course as of 02/21/23 1429   Tue Feb 21, 2023  1340 Patient undergoing supervised detox from alcohol at home with clonidine  and ativan . Concerned about elevated blood pressure.  [SU]  1428 BP is improved with Ativan  dose. She remains asymptomatic. EKG without concerning changes. Discussed blood pressure elevation in the setting of detox and PCP follow up.  [SU]    Clinical Course User Index [SU] Odell Balls, PA-C                                 Medical Decision Making Risk Prescription drug management.           Final Clinical Impression(s) / ED Diagnoses Final diagnoses:  Hypertension, unspecified type    Rx / DC Orders ED Discharge Orders     None         Odell Balls, PA-C 02/21/23 1430    Emil Share, DO 02/21/23 1430

## 2023-02-21 NOTE — Discharge Instructions (Signed)
 Follow up with your primary care doctor after you complete your detox regimen if your blood pressure remains consistently elevated.   Return to the ED with any chest discomfort, shortness of breath, lower leg swelling or new concern.

## 2023-02-21 NOTE — ED Triage Notes (Signed)
 C/o HTN and weakness. 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 Been taking Ativan regimen over the last 4 days.  Denies any pain.

## 2023-04-18 ENCOUNTER — Encounter (HOSPITAL_COMMUNITY): Payer: Self-pay | Admitting: Emergency Medicine

## 2023-04-18 ENCOUNTER — Observation Stay (HOSPITAL_COMMUNITY)
Admission: EM | Admit: 2023-04-18 | Discharge: 2023-04-20 | Disposition: A | Discharge status: Home / Self Care | Attending: Internal Medicine | Admitting: Internal Medicine

## 2023-04-18 DIAGNOSIS — K921 Melena: Secondary | ICD-10-CM | POA: Insufficient documentation

## 2023-04-18 DIAGNOSIS — K5731 Diverticulosis of large intestine without perforation or abscess with bleeding: Secondary | ICD-10-CM | POA: Insufficient documentation

## 2023-04-18 DIAGNOSIS — K625 Hemorrhage of anus and rectum: Secondary | ICD-10-CM | POA: Diagnosis present

## 2023-04-18 DIAGNOSIS — R55 Syncope and collapse: Secondary | ICD-10-CM

## 2023-04-18 DIAGNOSIS — Z853 Personal history of malignant neoplasm of breast: Secondary | ICD-10-CM | POA: Insufficient documentation

## 2023-04-18 DIAGNOSIS — Z79899 Other long term (current) drug therapy: Secondary | ICD-10-CM | POA: Diagnosis not present

## 2023-04-18 DIAGNOSIS — K922 Gastrointestinal hemorrhage, unspecified: Secondary | ICD-10-CM | POA: Diagnosis not present

## 2023-04-18 DIAGNOSIS — Z87891 Personal history of nicotine dependence: Secondary | ICD-10-CM | POA: Insufficient documentation

## 2023-04-18 DIAGNOSIS — I10 Essential (primary) hypertension: Secondary | ICD-10-CM | POA: Diagnosis not present

## 2023-04-18 LAB — CBC WITH DIFFERENTIAL/PLATELET
Abs Immature Granulocytes: 0.03 10*3/uL (ref 0.00–0.07)
Basophils Absolute: 0.1 10*3/uL (ref 0.0–0.1)
Basophils Relative: 1 %
Eosinophils Absolute: 0.2 10*3/uL (ref 0.0–0.5)
Eosinophils Relative: 2 %
HCT: 38.5 % (ref 36.0–46.0)
Hemoglobin: 12.3 g/dL (ref 12.0–15.0)
Immature Granulocytes: 0 %
Lymphocytes Relative: 39 %
Lymphs Abs: 4 10*3/uL (ref 0.7–4.0)
MCH: 31.2 pg (ref 26.0–34.0)
MCHC: 31.9 g/dL (ref 30.0–36.0)
MCV: 97.7 fL (ref 80.0–100.0)
Monocytes Absolute: 0.7 10*3/uL (ref 0.1–1.0)
Monocytes Relative: 7 %
Neutro Abs: 5.3 10*3/uL (ref 1.7–7.7)
Neutrophils Relative %: 51 %
Platelets: 207 10*3/uL (ref 150–400)
RBC: 3.94 MIL/uL (ref 3.87–5.11)
RDW: 13 % (ref 11.5–15.5)
WBC: 10.4 10*3/uL (ref 4.0–10.5)
nRBC: 0 % (ref 0.0–0.2)

## 2023-04-18 LAB — BASIC METABOLIC PANEL
Anion gap: 6 (ref 5–15)
BUN: 26 mg/dL — ABNORMAL HIGH (ref 8–23)
CO2: 26 mmol/L (ref 22–32)
Calcium: 8.9 mg/dL (ref 8.9–10.3)
Chloride: 108 mmol/L (ref 98–111)
Creatinine, Ser: 0.69 mg/dL (ref 0.44–1.00)
GFR, Estimated: >60 mL/min (ref >60)
Glucose, Bld: 134 mg/dL — ABNORMAL HIGH (ref 70–99)
Potassium: 4.1 mmol/L (ref 3.5–5.1)
Sodium: 140 mmol/L (ref 135–145)

## 2023-04-18 LAB — POC OCCULT BLOOD, ED: Fecal Occult Bld: POSITIVE — AB

## 2023-04-18 NOTE — ED Triage Notes (Signed)
 Pt arrives via EMS from home with concern for GI bleeding x2 hrs. Pt reports bright red blood in stool. Denies taking blood thinners. Pt cool/diaphoretic on scene, NS given per EMS. Pt Aox4 on arrival.

## 2023-04-18 NOTE — ED Provider Notes (Signed)
 [place] EMERGENCY DEPARTMENT AT [facility] Provider Note   CSN: [id number] Arrival date & time: 04/18/23  2225     History  Chief Complaint  Patient presents with   GI Bleeding    Wanda Dorsey is a 78 y.o. female history of breast cancer no longer on chemo or radiation, hypertension presented for GI bleed that began today.  Patient tried to have a bowel movement and states that she had a bunch of bright red blood come out of her rectum with some blood clots.  Patient is not on any blood thinners.  Patient is no history of GI bleeds.  Patient denies nausea vomiting diarrhea.  Patient states she does have a history of alcohol use in the past but has not recently drank.  Patient denies any fevers.  Home Medications Prior to Admission medications   Medication Sig Start Date End Date Taking? Authorizing Provider  anastrozole (ARIMIDEX) 1 MG tablet Take 1 mg by mouth daily.  04/10/11   [provider]  benzonatate (TESSALON) 100 MG capsule Take 1 capsule (100 mg total) by mouth every 8 (eight) hours. 01/14/23   Renne Crigler, PA-C  cetirizine (ZYRTEC) 10 MG tablet Take 10 mg by mouth daily as needed for allergies.    [provider]  cholecalciferol (VITAMIN D) 1000 UNITS tablet Take 1,000 Units by mouth daily.    [provider]  lisinopril (ZESTRIL) 20 MG tablet Take 20 mg by mouth daily. 05/30/19   [provider]  LORazepam (ATIVAN) 0.5 MG tablet Take 0.5 mg by mouth 3 (three) times daily as needed for anxiety.  05/30/19   [provider]  metoprolol tartrate (LOPRESSOR) 50 MG tablet Take 50 mg by mouth 2 (two) times daily. 02/05/19   [provider]  naproxen sodium (ALEVE) 220 MG tablet Take 220 mg by mouth 2 (two) times daily as needed (pain).    [provider]  omeprazole (PRILOSEC) 20 MG capsule Take 20 mg by mouth daily.    [provider]  PROAIR RESPICLICK 108 [phone] Base) MCG/ACT AEPB Take 2 puffs  by mouth 4 (four) times daily as needed (sob/wheezing).  05/03/19   [provider]      Allergies    Other and Penicillins    Review of Systems   Review of Systems  Physical Exam Updated Vital Signs BP (!) 163/75   Pulse (!) 54   Temp 98.4 F (36.9 C) (Oral)   Resp 16   Ht 5\' 2"  (1.575 m)   Wt 60.6 kg   SpO2 97%   BMI 24.42 kg/m  Physical Exam Constitutional:      General: She is not in acute distress. Cardiovascular:     Rate and Rhythm: Regular rhythm. Bradycardia present.     Pulses: Normal pulses.     Heart sounds: Normal heart sounds.  Abdominal:     Palpations: Abdomen is soft.     Tenderness: There is no abdominal tenderness. There is no guarding or rebound.  Genitourinary:    Comments: Chaperone: Jim Desanctis, RN Obvious bright red blood noted No rectal fissures or abnormalities Rectal tone intact Musculoskeletal:        General: Normal range of motion.  Skin:    General: Skin is warm and dry.     Capillary Refill: Capillary refill takes 2 to 3 seconds.  Neurological:     Mental Status: She is alert and oriented to person, place, and time.  Psychiatric:  Mood and Affect: Mood normal.     ED Results / Procedures / Treatments   Labs (all labs ordered are listed, but only abnormal results are displayed) Labs Reviewed  BASIC METABOLIC PANEL - Abnormal; Notable for the following components:      Result Value   Glucose, Bld 134 (*)    BUN 26 (*)    All other components within normal limits  CBC WITH DIFFERENTIAL/PLATELET  POC OCCULT BLOOD, ED  TYPE AND SCREEN    EKG None  Radiology No results found.  Procedures Procedures    Medications Ordered in ED Medications - No data to display  ED Course/ Medical Decision Making/ A&P                                 Medical Decision Making Amount and/or Complexity of Data Reviewed Labs: ordered.  Risk Decision regarding hospitalization.   Wanda Dorsey 78 y.o. presented  today for GIB. Working DDx that I considered at this time includes, but not limited to, Esophagitis, Mallory Weiss/Boerhaave, Variceal bleeding, PUD/gastritis/ulcers, diverticular bleed, colon cancer, rectal bleed, internal/external hemorrhoids  R/o DDx:  Esophagitis, Mallory Weiss/Boerhaave, Variceal bleeding, PUD/gastritis/ulcers, diverticular bleed, colon cancer: These are considered less likely due to history of present illness, physical exam, labs/imaging findings  Review of prior external notes: 01/14/2023 telephone  Unique Tests and My Independent Interpretation:  CBC: Unremarkable BMP: Unremarkable Type and screen: Pending Fecal occult: Positive  Social Determinants of Health: none  Discussion with Independent Historian:  EMS  Discussion of Management of Tests:  [place], MD Hospitalist  Risk: High: hospitalization or escalation of hospital-level care  Risk Stratification Score: None  Plan: On exam patient was in no acute distress with stable vitals. Physical exam showed obvious bright red blood in the rectal area without hemorrhoids with a chaperone present.  Will obtain labs and further assess.  Patient states she is never had a colonoscopy however feel that patient will need to be admitted to have colonoscopy done in the morning.  EMS states that when he first found the patient she was diaphoretic and hypotensive that initially resolved with fluids however this occurred after a bowel movement and patient is not presenting this way and so feel that this was a vagal episode.  Patient's labs are reassuring.  At this time patient is stable to see a colonoscopy in the morning.  I messaged the on-call GI providers and will consult hospitalist for admission.  I spoke to the hospitalist and patient was accepted for admission.  Patient stable to be admitted at this time.  This chart was dictated using voice recognition software.  Despite best efforts to proofread,  errors can occur  which can change the documentation meaning.        Final Clinical Impression(s) / ED Diagnoses Final diagnoses:  Acute GI bleeding    Rx / DC Orders ED Discharge Orders     None         Remi Deter 04/19/23 0015    Derwood Kaplan, MD 04/21/23 1058

## 2023-04-19 ENCOUNTER — Other Ambulatory Visit: Payer: Self-pay

## 2023-04-19 DIAGNOSIS — K922 Gastrointestinal hemorrhage, unspecified: Secondary | ICD-10-CM | POA: Diagnosis present

## 2023-04-19 DIAGNOSIS — I10 Essential (primary) hypertension: Secondary | ICD-10-CM

## 2023-04-19 DIAGNOSIS — R55 Syncope and collapse: Secondary | ICD-10-CM

## 2023-04-19 LAB — TYPE AND SCREEN
ABO/RH(D): A POS
Antibody Screen: NEGATIVE

## 2023-04-19 LAB — HEMOGLOBIN AND HEMATOCRIT, BLOOD
HCT: 35.3 % — ABNORMAL LOW (ref 36.0–46.0)
HCT: 34.5 % — ABNORMAL LOW (ref 36.0–46.0)
HCT: 38.4 % (ref 36.0–46.0)
Hemoglobin: 11.3 g/dL — ABNORMAL LOW (ref 12.0–15.0)
Hemoglobin: 10.8 g/dL — ABNORMAL LOW (ref 12.0–15.0)
Hemoglobin: 12.4 g/dL (ref 12.0–15.0)

## 2023-04-19 LAB — MRSA NEXT GEN BY PCR, NASAL: MRSA by PCR Next Gen: NOT DETECTED

## 2023-04-19 MED ORDER — ANASTROZOLE 1 MG PO TABS
1.0000 mg | ORAL_TABLET | Freq: Every day | ORAL | Status: DC
  Administered 2023-04-19 – 2023-04-20 (×2): 1 mg via ORAL
  Filled 2023-04-19 (×2): qty 1

## 2023-04-19 MED ORDER — SODIUM CHLORIDE 0.9 % IV SOLN: INTRAVENOUS | Status: DC

## 2023-04-19 MED ORDER — LISINOPRIL 20 MG PO TABS
20.0000 mg | ORAL_TABLET | Freq: Once | ORAL | Status: AC
  Administered 2023-04-20: 20 mg via ORAL
  Filled 2023-04-19: qty 1

## 2023-04-19 MED ORDER — PEG 3350-KCL-NA BICARB-NACL 420 G PO SOLR
4000.0000 mL | Freq: Once | ORAL | Status: AC
  Administered 2023-04-19: 4000 mL via ORAL

## 2023-04-19 MED ORDER — VITAMIN D 25 MCG (1000 UNIT) PO TABS
5000.0000 [IU] | ORAL_TABLET | Freq: Every day | ORAL | Status: DC
  Administered 2023-04-19 – 2023-04-20 (×2): 5000 [IU] via ORAL
  Filled 2023-04-19 (×2): qty 5

## 2023-04-19 MED ORDER — METOPROLOL TARTRATE 25 MG PO TABS
25.0000 mg | ORAL_TABLET | Freq: Two times a day (BID) | ORAL | Status: DC
  Administered 2023-04-19 (×2): 25 mg via ORAL
  Filled 2023-04-19 (×2): qty 1

## 2023-04-19 MED ORDER — PANTOPRAZOLE SODIUM 40 MG IV SOLR
40.0000 mg | Freq: Two times a day (BID) | INTRAVENOUS | Status: DC
  Administered 2023-04-19 – 2023-04-20 (×3): 40 mg via INTRAVENOUS
  Filled 2023-04-19 (×3): qty 10

## 2023-04-19 MED ORDER — LORATADINE 10 MG PO TABS
10.0000 mg | ORAL_TABLET | Freq: Every day | ORAL | Status: DC
  Administered 2023-04-19 – 2023-04-20 (×2): 10 mg via ORAL
  Filled 2023-04-19 (×2): qty 1

## 2023-04-19 MED ORDER — ALBUTEROL SULFATE (2.5 MG/3ML) 0.083% IN NEBU: 2.5000 mg | INHALATION_SOLUTION | Freq: Four times a day (QID) | RESPIRATORY_TRACT | Status: DC | PRN

## 2023-04-19 NOTE — Plan of Care (Signed)

## 2023-04-19 NOTE — Consult Note (Addendum)
 Reason for Consult: Rectal bleeding. Referring Physician: [facility]  Wanda Dorsey is an 78 y.o. female.  HPI: Wanda Dorsey is a 78 year old white female, with a previous history of breast cancer status post right mastectomy in 2010 and a history of alcohol abuse intermittently, who presented to the emergency room after dinner last night when she had sudden onset of bright red rectal bleeding causing weakness and diaphoresis causing her to be lightheaded weak and dizzy. Her son who is a Engineer, civil (consulting) was at home with her and probably help her lay flat on EMS. Review of the records indicates that she lost consciousness briefly before coming to the hospital but she claims she did not lose consciousness at all.  She denies being on any anticoagulants except for occasionally which she takes for aches and pains. In the ER she was noted to be cold clammy and diaphoretic but was hemodynamically stable. She had several episodes of bright red bleeding with multiple clots prior to presentation to the emergency room but none since she has been in the hospital. She has 1 BM per day and has occasional rectal bleeding if she has spicy food but this does not happen very frequently. She has never had a colonoscopy. She denies having abdominal pain, nausea or vomiting with the rectal bleeding. There is no family history of colon cancer. In the ER she was noted to have a hemoglobin from 2.3 g/dL which had dropped from 16 g/dL 3 months ago. The creatinine and BUN were normal.  Hemoglobin today is a 10.8 g/dL. She gives a history of being diagnosed with fatty liver when she was seeing Dr. Darnelle Catalan for her breast cancer. She has been told by her PCP that her transaminases have been elevated. There is no known history of cirrhosis as per the patient.  Past Medical History:  Diagnosis Date   Breast cancer status post right mastectomy 2010     Alcohol abuse/fatty liver    Hearing loss    Hypertension    Past Surgical History:   Procedure Laterality Date   Right mastectomy 2010      CESAREAN SECTION     History reviewed. No pertinent family history.  Social History:  reports that she has quit smoking. She has never used smokeless tobacco. She reports current alcohol use. She reports that she does not use drugs.  Is having a problem with alcohol which she drinks up to three fourths bottle of vodka per day several days in a week and has intermittent periods of sobriety.  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  Allergies:  Allergies  Allergen Reactions   Other Itching    Tree nuts- itching Hot peppers- jalapeno, etc..- get rash around mouth    Penicillins Rash and Other (See Comments)    Reaction: unknown  Has patient had a PCN reaction causing immediate rash, facial/tongue/throat swelling, SOB or lightheadedness with hypotension: unknown Has patient had a PCN reaction causing severe rash involving mucus membranes or skin necrosis: unknown Has patient had a PCN reaction that required hospitalization: no Has patient had a PCN reaction occurring within the last 10 years: no If all of the above answers are "NO", then may proceed with Cephalosporin use.    Medications: I have reviewed the patient's current medications. Prior to Admission:  Medications Prior to Admission  Medication Sig Dispense Refill Last Dose/Taking   anastrozole (ARIMIDEX)  1 MG tablet Take 1 mg by mouth daily.    04/18/2023   cetirizine (ZYRTEC) 10 MG tablet Take 10 mg by mouth daily as needed for allergies.   04/18/2023   cholecalciferol (VITAMIN D) 1000 UNITS tablet Take 5,000 Units by mouth daily.   04/18/2023   cloNIDine (CATAPRES) 0.1 MG tablet Take 0.1 mg by mouth every 6 (six) hours as needed.   Taking As Needed   lisinopril (ZESTRIL) 20 MG tablet Take 20 mg by mouth daily.   Past Month   metoprolol tartrate (LOPRESSOR) 100 MG tablet Take 100 mg by mouth 2 (two)  times daily.   04/18/2023   Multiple Vitamin (MULTIVITAMIN WITH MINERALS) TABS tablet Take 1 tablet by mouth daily.   04/18/2023   naproxen sodium (ALEVE) 220 MG tablet Take 220 mg by mouth 2 (two) times daily as needed (pain).   Taking As Needed   PROAIR RESPICLICK 108 (90 Base) MCG/ACT AEPB Take 2 puffs by mouth 4 (four) times daily as needed (sob/wheezing).    Taking As Needed   Scheduled:  anastrozole  1 mg Oral Daily   cholecalciferol  5,000 Units Oral Daily   loratadine  10 mg Oral Daily   metoprolol tartrate  25 mg Oral BID   pantoprazole (PROTONIX) IV  40 mg Intravenous Q12H   Continuous: [id number]  Results for orders placed or performed during the hospital encounter of 04/18/23 (from the past 48 hours)  Basic metabolic panel     Status: Abnormal   Collection Time: 04/18/23 10:40 PM  Result Value Ref Range   Sodium 140 135 - 145 mmol/L   Potassium 4.1 3.5 - 5.1 mmol/L   Chloride 108 98 - 111 mmol/L   CO2 26 22 - 32 mmol/L   Glucose, Bld 134 (H) 70 - 99 mg/dL    Comment: Glucose reference range applies only to samples taken after fasting for at least 8 hours.   BUN 26 (H) 8 - 23 mg/dL   Creatinine, Ser 7.25 0.44 - 1.00 mg/dL   Calcium 8.9 8.9 - 36.6 mg/dL   GFR, Estimated >44 >03 mL/min    Comment: (NOTE) Calculated using the CKD-EPI Creatinine Equation (2021)    Anion gap 6 5 - 15    Comment: Performed at [facility], [address] [place], [place] [address]  CBC with Differential     Status: None   Collection Time: 04/18/23 10:40 PM  Result Value Ref Range   WBC 10.4 4.0 - 10.5 K/uL   RBC 3.94 3.87 - 5.11 MIL/uL   Hemoglobin 12.3 12.0 - 15.0 g/dL   HCT 95.6 38.7 - 56.4 %   MCV 97.7 80.0 - 100.0 fL   MCH 31.2 26.0 - 34.0 pg   MCHC 31.9 30.0 - 36.0 g/dL   RDW 33.2 95.1 - 88.4 %   Platelets 207 150 - 400 K/uL   nRBC 0.0 0.0 - 0.2 %   Neutrophils Relative % 51 %   Neutro Abs 5.3 1.7 - 7.7 K/uL   Lymphocytes Relative 39 %   Lymphs Abs 4.0  0.7 - 4.0 K/uL   Monocytes Relative 7 %   Monocytes Absolute 0.7 0.1 - 1.0 K/uL   Eosinophils Relative 2 %   Eosinophils Absolute 0.2 0.0 - 0.5 K/uL   Basophils Relative 1 %   Basophils Absolute 0.1 0.0 - 0.1 K/uL   Immature Granulocytes 0 %   Abs Immature Granulocytes 0.03 0.00 - 0.07 K/uL  Comment: Performed at [facility], [address], [place], [place] [address]  Type and screen [facility]  HOSPITAL     Status: None   Collection Time: 04/18/23 10:40 PM  Result Value Ref Range   ABO/RH(D) A POS    Antibody Screen NEG    Sample Expiration      04/21/2023,2359 Performed at [facility], [address], [place], [place] [address]   POC occult blood, ED Provider will collect     Status: Abnormal   Collection Time: 04/18/23 11:41 PM  Result Value Ref Range   Fecal Occult Bld POSITIVE (A) NEGATIVE  Hemoglobin and hematocrit, blood     Status: Abnormal   Collection Time: 04/19/23  4:11 AM  Result Value Ref Range   Hemoglobin 11.3 (L) 12.0 - 15.0 g/dL   HCT 82.9 (L) 56.2 - 13.0 %    Comment: Performed at [facility], [address], [place], [place] [address]  Hemoglobin and hematocrit, blood     Status: Abnormal   Collection Time: 04/19/23  9:03 AM  Result Value Ref Range   Hemoglobin 10.8 (L) 12.0 - 15.0 g/dL   HCT 46.9 (L) 62.9 - 52.8 %    Comment: Performed at [facility], [address] [place], [place] [address]    Review of Systems  Constitutional:  Positive for activity change and fatigue. Negative for appetite change, chills, diaphoresis, fever and unexpected weight change.  HENT: Negative.    Eyes: Negative.   Respiratory: Negative.    Cardiovascular: Negative.   Gastrointestinal:  Positive for anal bleeding, blood in stool and diarrhea. Negative for abdominal distention, abdominal pain, constipation and nausea.  Endocrine: Negative.   Genitourinary: Negative.    Musculoskeletal:  Positive for arthralgias.  Allergic/Immunologic: Negative.   Neurological:  Positive for dizziness, weakness and light-headedness.  Psychiatric/Behavioral: Negative.     Blood pressure (!) 160/77, pulse 67, temperature 98.6 F (37 C), resp. rate 18, height 5\' 2"  (1.575 m), weight 60.6 kg, SpO2 95%. Physical Exam Constitutional:      General: She is not in acute distress.    Appearance: Normal appearance. She is not ill-appearing or toxic-appearing.  HENT:     Head: Normocephalic and atraumatic.  Eyes:     Extraocular Movements: Extraocular movements intact.     Conjunctiva/sclera: Conjunctivae normal.     Pupils: Pupils are equal, round, and reactive to light.  Cardiovascular:     Rate and Rhythm: Normal rate and regular rhythm.     Pulses: Normal pulses.  Pulmonary:     Effort: Pulmonary effort is normal.     Breath sounds: Normal breath sounds.  Abdominal:     General: Bowel sounds are normal. There is no distension.     Tenderness: There is no abdominal tenderness. There is no guarding or rebound.  Musculoskeletal:     Cervical back: Normal range of motion and neck supple.  Skin:    General: Skin is warm and dry.  Neurological:     General: No focal deficit present.     Mental Status: She is alert and oriented to person, place, and time.   Assessment/Plan: 1) Rectal bleeding with no history of abdominal pain and a drop in the hemoglobin 4 gms-plans are to proceed with a colonoscopy.  Will write the prep orders for the patient tonight and allow her clear liquid diet till 4 hours prior to the procedure.  2) Fatty infiltration of the  liver/history of alcohol abuse/elevated transaminases-check hepatic function panel. 3) Hypertension-all antihypertensives have been on hold since admission.  Charna Elizabeth 04/19/2023, 12:42 PM

## 2023-04-19 NOTE — H&P (View-Only) (Signed)
 Reason for Consult: Rectal bleeding. Referring Physician: [facility] hospitalist  Wanda Dorsey is an 78 y.o. female.  HPI: Wanda Dorsey is a 78 year old white female, with a previous history of breast cancer status post right mastectomy in 2010 and a history of alcohol abuse intermittently, who presented to the emergency room after dinner last night when she had sudden onset of bright red rectal bleeding causing weakness and diaphoresis causing her to be lightheaded weak and dizzy. Her son who is a Engineer, civil (consulting) was at home with her and probably help her lay flat on EMS. Review of the records indicates that she lost consciousness briefly before coming to the hospital but she claims she did not lose consciousness at all.  She denies being on any anticoagulants except for occasionally which she takes for aches and pains. In the ER she was noted to be cold clammy and diaphoretic but was hemodynamically stable. She had several episodes of bright red bleeding with multiple clots prior to presentation to the emergency room but none since she has been in the hospital. She has 1 BM per day and has occasional rectal bleeding if she has spicy food but this does not happen very frequently. She has never had a colonoscopy. She denies having abdominal pain, nausea or vomiting with the rectal bleeding. There is no family history of colon cancer. In the ER she was noted to have a hemoglobin from 2.3 g/dL which had dropped from 16 g/dL 3 months ago. The creatinine and BUN were normal.  Hemoglobin today is a 10.8 g/dL. She gives a history of being diagnosed with fatty liver when she was seeing Dr. Darnelle Catalan for her breast cancer. She has been told by her PCP that her transaminases have been elevated. There is no known history of cirrhosis as per the patient.  Past Medical History:  Diagnosis Date   Breast cancer status post right mastectomy 2010     Alcohol abuse/fatty liver    Hearing loss    Hypertension    Past Surgical History:   Procedure Laterality Date   Right mastectomy 2010      CESAREAN SECTION     History reviewed. No pertinent family history.  Social History:  reports that she has quit smoking. She has never used smokeless tobacco. She reports current alcohol use. She reports that she does not use drugs.  Is having a problem with alcohol which she drinks up to three fourths bottle of vodka per day several days in a week and has intermittent periods of sobriety.  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  Allergies:  Allergies  Allergen Reactions   Other Itching    Tree nuts- itching Hot peppers- jalapeno, etc..- get rash around mouth    Penicillins Rash and Other (See Comments)    Reaction: unknown  Has patient had a PCN reaction causing immediate rash, facial/tongue/throat swelling, SOB or lightheadedness with hypotension: unknown Has patient had a PCN reaction causing severe rash involving mucus membranes or skin necrosis: unknown Has patient had a PCN reaction that required hospitalization: no Has patient had a PCN reaction occurring within the last 10 years: no If all of the above answers are "NO", then may proceed with Cephalosporin use.    Medications: I have reviewed the patient's current medications. Prior to Admission:  Medications Prior to Admission  Medication Sig Dispense Refill Last Dose/Taking   anastrozole (ARIMIDEX)  1 MG tablet Take 1 mg by mouth daily.    04/18/2023   cetirizine (ZYRTEC) 10 MG tablet Take 10 mg by mouth daily as needed for allergies.   04/18/2023   cholecalciferol (VITAMIN D) 1000 UNITS tablet Take 5,000 Units by mouth daily.   04/18/2023   cloNIDine (CATAPRES) 0.1 MG tablet Take 0.1 mg by mouth every 6 (six) hours as needed.   Taking As Needed   lisinopril (ZESTRIL) 20 MG tablet Take 20 mg by mouth daily.   Past Month   metoprolol tartrate (LOPRESSOR) 100 MG tablet Take 100 mg by mouth 2 (two)  times daily.   04/18/2023   Multiple Vitamin (MULTIVITAMIN WITH MINERALS) TABS tablet Take 1 tablet by mouth daily.   04/18/2023   naproxen sodium (ALEVE) 220 MG tablet Take 220 mg by mouth 2 (two) times daily as needed (pain).   Taking As Needed   PROAIR RESPICLICK 108 (90 Base) MCG/ACT AEPB Take 2 puffs by mouth 4 (four) times daily as needed (sob/wheezing).    Taking As Needed   Scheduled:  anastrozole  1 mg Oral Daily   cholecalciferol  5,000 Units Oral Daily   loratadine  10 mg Oral Daily   metoprolol tartrate  25 mg Oral BID   pantoprazole (PROTONIX) IV  40 mg Intravenous Q12H   Continuous: [id number]  Results for orders placed or performed during the hospital encounter of 04/18/23 (from the past 48 hours)  Basic metabolic panel     Status: Abnormal   Collection Time: 04/18/23 10:40 PM  Result Value Ref Range   Sodium 140 135 - 145 mmol/L   Potassium 4.1 3.5 - 5.1 mmol/L   Chloride 108 98 - 111 mmol/L   CO2 26 22 - 32 mmol/L   Glucose, Bld 134 (H) 70 - 99 mg/dL    Comment: Glucose reference range applies only to samples taken after fasting for at least 8 hours.   BUN 26 (H) 8 - 23 mg/dL   Creatinine, Ser 7.25 0.44 - 1.00 mg/dL   Calcium 8.9 8.9 - 36.6 mg/dL   GFR, Estimated >44 >03 mL/min    Comment: (NOTE) Calculated using the CKD-EPI Creatinine Equation (2021)    Anion gap 6 5 - 15    Comment: Performed at [facility], [address] [place], [place] [address]  CBC with Differential     Status: None   Collection Time: 04/18/23 10:40 PM  Result Value Ref Range   WBC 10.4 4.0 - 10.5 K/uL   RBC 3.94 3.87 - 5.11 MIL/uL   Hemoglobin 12.3 12.0 - 15.0 g/dL   HCT 95.6 38.7 - 56.4 %   MCV 97.7 80.0 - 100.0 fL   MCH 31.2 26.0 - 34.0 pg   MCHC 31.9 30.0 - 36.0 g/dL   RDW 33.2 95.1 - 88.4 %   Platelets 207 150 - 400 K/uL   nRBC 0.0 0.0 - 0.2 %   Neutrophils Relative % 51 %   Neutro Abs 5.3 1.7 - 7.7 K/uL   Lymphocytes Relative 39 %   Lymphs Abs 4.0  0.7 - 4.0 K/uL   Monocytes Relative 7 %   Monocytes Absolute 0.7 0.1 - 1.0 K/uL   Eosinophils Relative 2 %   Eosinophils Absolute 0.2 0.0 - 0.5 K/uL   Basophils Relative 1 %   Basophils Absolute 0.1 0.0 - 0.1 K/uL   Immature Granulocytes 0 %   Abs Immature Granulocytes 0.03 0.00 - 0.07 K/uL  Comment: Performed at [facility], [address], [place], [place] [address]  Type and screen [facility]  HOSPITAL     Status: None   Collection Time: 04/18/23 10:40 PM  Result Value Ref Range   ABO/RH(D) A POS    Antibody Screen NEG    Sample Expiration      04/21/2023,2359 Performed at [facility], [address], [place], [place] [address]   POC occult blood, ED Provider will collect     Status: Abnormal   Collection Time: 04/18/23 11:41 PM  Result Value Ref Range   Fecal Occult Bld POSITIVE (A) NEGATIVE  Hemoglobin and hematocrit, blood     Status: Abnormal   Collection Time: 04/19/23  4:11 AM  Result Value Ref Range   Hemoglobin 11.3 (L) 12.0 - 15.0 g/dL   HCT 82.9 (L) 56.2 - 13.0 %    Comment: Performed at [facility], [address], [place], [place] [address]  Hemoglobin and hematocrit, blood     Status: Abnormal   Collection Time: 04/19/23  9:03 AM  Result Value Ref Range   Hemoglobin 10.8 (L) 12.0 - 15.0 g/dL   HCT 46.9 (L) 62.9 - 52.8 %    Comment: Performed at [facility], [address] [place], [place] [address]    Review of Systems  Constitutional:  Positive for activity change and fatigue. Negative for appetite change, chills, diaphoresis, fever and unexpected weight change.  HENT: Negative.    Eyes: Negative.   Respiratory: Negative.    Cardiovascular: Negative.   Gastrointestinal:  Positive for anal bleeding, blood in stool and diarrhea. Negative for abdominal distention, abdominal pain, constipation and nausea.  Endocrine: Negative.   Genitourinary: Negative.    Musculoskeletal:  Positive for arthralgias.  Allergic/Immunologic: Negative.   Neurological:  Positive for dizziness, weakness and light-headedness.  Psychiatric/Behavioral: Negative.     Blood pressure (!) 160/77, pulse 67, temperature 98.6 F (37 C), resp. rate 18, height 5\' 2"  (1.575 m), weight 60.6 kg, SpO2 95%. Physical Exam Constitutional:      General: She is not in acute distress.    Appearance: Normal appearance. She is not ill-appearing or toxic-appearing.  HENT:     Head: Normocephalic and atraumatic.  Eyes:     Extraocular Movements: Extraocular movements intact.     Conjunctiva/sclera: Conjunctivae normal.     Pupils: Pupils are equal, round, and reactive to light.  Cardiovascular:     Rate and Rhythm: Normal rate and regular rhythm.     Pulses: Normal pulses.  Pulmonary:     Effort: Pulmonary effort is normal.     Breath sounds: Normal breath sounds.  Abdominal:     General: Bowel sounds are normal. There is no distension.     Tenderness: There is no abdominal tenderness. There is no guarding or rebound.  Musculoskeletal:     Cervical back: Normal range of motion and neck supple.  Skin:    General: Skin is warm and dry.  Neurological:     General: No focal deficit present.     Mental Status: She is alert and oriented to person, place, and time.   Assessment/Plan: 1) Rectal bleeding with no history of abdominal pain and a drop in the hemoglobin 4 gms-plans are to proceed with a colonoscopy.  Will write the prep orders for the patient tonight and allow her clear liquid diet till 4 hours prior to the procedure.  2) Fatty infiltration of the  liver/history of alcohol abuse/elevated transaminases-check hepatic function panel. 3) Hypertension-all antihypertensives have been on hold since admission.  Charna Elizabeth 04/19/2023, 12:42 PM

## 2023-04-19 NOTE — Progress Notes (Signed)
 Brief same day note:  Patient is a 78 year old female with history of hypertension,  prior alcohol abuse, breast cancer status post right mastectomy in 2010 who presented for the evaluation of rectal bleeding.  On presentation, she was hemodynamically stable.  Labs showed hemoglobin of 12.3.  Hemoglobin was 16 about 3 months ago.  GI consulted for Dr. Nicholes Mango group.  Being admitted for the observation/further management of hematochezia.  Patient seen and examined at bedside this morning.  Hemodynamically stable.  No further episodes of hematochezia after admission.  Patient lives with her husband, ambulatory at baseline.  She denies any abdomen pain, nausea or vomiting.  Assessment and plan:  Acute lower GI bleed/hematochezia: Presented with painless rectal bleeding, hemoglobin in the range of 12.3.  This morning is 11.3.  Hemoglobin was 16 about 3 months ago.  Bright red blood noted on rectal exam, FOBT positive.  Not on anticoagulation or antiplatelet agents.  No further episodes of hematochezia in the ED. currently hemoglobin stable.  GI consulted.  Also ordered Protonix by admitting physician.  Continue to monitor H&H.  Syncope: No focal neurological deficits.  Had a brief episode of unresponsiveness at home along with bloody bowel movement.  Continue monitoring.    History of hypertension: Currently BP stable.  Home medications on hold, if it trends up, will start on home medication metoprolol and lisinopril  History of alcoholism: Last drink was about 6 weeks ago.  No withdrawal symptoms

## 2023-04-19 NOTE — H&P (Signed)
 History and Physical    Wanda Dorsey:[id number] DOB: 1945/10/08 DOA: 04/18/2023  PCP: Salli Real, MD  Patient coming from: Home  Chief Complaint: Rectal bleeding  HPI: Wanda Dorsey is a 78 y.o. female with medical history significant of hypertension, alcohol abuse, breast cancer status post right mastectomy in 2010, hearing loss presented to the ED via EMS for evaluation of rectal bleeding.  Not on anticoagulation.  Patient was cool/diaphoretic on EMS arrival and was given 500 mL normal saline.  Vital signs on arrival: Temperature 98.4 F, pulse 53, respiratory rate 16, blood pressure 149/76, and SpO2 97% on room air.  Labs showing hemoglobin 12.3 (was 16.0 on labs 3 months ago), BUN 26, creatinine 0.6.  Bright red blood noted on rectal exam and FOBT positive.  Patient did not have any further episodes of hematochezia in the ED and remained hemodynamically stable. ED PA sent a message to on-call provider for GI (Dr. Nicholes Mango) requesting consultation in the morning.  [facility] called to admit.  Patient states she was feeling well during the day and had a regular bowel movement in the morning.  Then around 7 PM all of a sudden she had some mild lower abdominal cramping and when she went to the toilet she had a large bloody bowel movement.  She describes it as bright red blood with blood clots mixed in.  She continued to have additional episodes of bloody bowel movements.  Later felt lightheaded and briefly lost consciousness just before EMS arrived.  Patient states her son was there with her and she did not fall or hit her head.  No prior EGD or colonoscopy done.  Not on anticoagulation or antiplatelet agents.  Denies any lightheadedness/dizziness at this time.  Denies chest pain or shortness of breath.  Denies any abdominal pain.  Denies any episodes of hematemesis.  She reports history of alcoholism but completely quit drinking 6 weeks ago.  Review of Systems:  Review of Systems  All other systems  reviewed and are negative.   Past Medical History:  Diagnosis Date   Cancer (HCC)    Hearing loss    Hypertension     Past Surgical History:  Procedure Laterality Date   BREAST SURGERY     CESAREAN SECTION       reports that she has quit smoking. She has never used smokeless tobacco. She reports current alcohol use. She reports that she does not use drugs.  Allergies  Allergen Reactions   Other Itching    Tree nuts- itching Hot peppers- jalapeno, etc..- get rash around mouth    Penicillins Rash and Other (See Comments)    Reaction: unknown  Has patient had a PCN reaction causing immediate rash, facial/tongue/throat swelling, SOB or lightheadedness with hypotension: unknown Has patient had a PCN reaction causing severe rash involving mucus membranes or skin necrosis: unknown Has patient had a PCN reaction that required hospitalization: no Has patient had a PCN reaction occurring within the last 10 years: no If all of the above answers are "NO", then may proceed with Cephalosporin use.     History reviewed. No pertinent family history.  Prior to Admission medications   Medication Sig Start Date End Date Taking? Authorizing Provider  anastrozole (ARIMIDEX) 1 MG tablet Take 1 mg by mouth daily.  04/10/11  Yes [provider]  cetirizine (ZYRTEC) 10 MG tablet Take 10 mg by mouth daily as needed for allergies.   Yes [provider]  cholecalciferol (VITAMIN D) 1000  UNITS tablet Take 5,000 Units by mouth daily.   Yes [provider]  cloNIDine (CATAPRES) 0.1 MG tablet Take 0.1 mg by mouth every 6 (six) hours as needed.   Yes [provider]  lisinopril (ZESTRIL) 20 MG tablet Take 20 mg by mouth daily. 05/30/19  Yes [provider]  metoprolol tartrate (LOPRESSOR) 100 MG tablet Take 100 mg by mouth 2 (two) times daily.   Yes [provider]  Multiple Vitamin (MULTIVITAMIN WITH MINERALS) TABS tablet Take 1 tablet by mouth daily.    Yes [provider]  naproxen sodium (ALEVE) 220 MG tablet Take 220 mg by mouth 2 (two) times daily as needed (pain).   Yes [provider]  PROAIR RESPICLICK 108 (90 Base) MCG/ACT AEPB Take 2 puffs by mouth 4 (four) times daily as needed (sob/wheezing).  05/03/19  Yes [provider]    Physical Exam: Vitals:   04/19/23 0030 04/19/23 0100 04/19/23 0130 04/19/23 0200  BP: (!) 152/78 (!) 142/68 (!) 146/74 (!) 141/87  Pulse: (!) 58 (!) 50 (!) 57 (!) 57  Resp: 12 17 14 13   Temp:      TempSrc:      SpO2: 97% 95% 94% 95%  Weight:      Height:        Physical Exam Vitals reviewed.  Constitutional:      General: She is not in acute distress. HENT:     Head: Normocephalic and atraumatic.  Eyes:     Extraocular Movements: Extraocular movements intact.  Cardiovascular:     Rate and Rhythm: Normal rate and regular rhythm.     Pulses: Normal pulses.  Pulmonary:     Effort: Pulmonary effort is normal. No respiratory distress.     Breath sounds: Normal breath sounds. No wheezing or rales.  Abdominal:     General: Bowel sounds are normal. There is no distension.     Palpations: Abdomen is soft.     Tenderness: There is no abdominal tenderness. There is no guarding.  Musculoskeletal:     Cervical back: Normal range of motion.     Right lower leg: No edema.     Left lower leg: No edema.  Skin:    General: Skin is warm and dry.  Neurological:     General: No focal deficit present.     Mental Status: She is alert and oriented to person, place, and time.     Cranial Nerves: No cranial nerve deficit.     Sensory: No sensory deficit.     Motor: No weakness.     Labs on Admission: I have personally reviewed following labs and imaging studies  CBC: Recent Labs  Lab 04/18/23 2240  WBC 10.4  NEUTROABS 5.3  HGB 12.3  HCT 38.5  MCV 97.7  PLT 207   Basic Metabolic Panel: Recent Labs  Lab 04/18/23 2240  NA 140  K 4.1  CL 108  CO2 26  GLUCOSE 134*   BUN 26*  CREATININE 0.69  CALCIUM 8.9   GFR: Estimated Creatinine Clearance: 49.7 mL/min (by C-G formula based on SCr of 0.69 mg/dL). Liver Function Tests: No results for input(s): "AST", "ALT", "ALKPHOS", "BILITOT", "PROT", "ALBUMIN" in the last 168 hours. No results for input(s): "LIPASE", "AMYLASE" in the last 168 hours. No results for input(s): "AMMONIA" in the last 168 hours. Coagulation Profile: No results for input(s): "INR", "PROTIME" in the last 168 hours. Cardiac Enzymes: No results for input(s): "CKTOTAL", "CKMB", "CKMBINDEX", "TROPONINI" in the  last 168 hours. BNP (last 3 results) No results for input(s): "PROBNP" in the last 8760 hours. HbA1C: No results for input(s): "HGBA1C" in the last 72 hours. CBG: No results for input(s): "GLUCAP" in the last 168 hours. Lipid Profile: No results for input(s): "CHOL", "HDL", "LDLCALC", "TRIG", "CHOLHDL", "LDLDIRECT" in the last 72 hours. Thyroid Function Tests: No results for input(s): "TSH", "T4TOTAL", "FREET4", "T3FREE", "THYROIDAB" in the last 72 hours. Anemia Panel: No results for input(s): "VITAMINB12", "FOLATE", "FERRITIN", "TIBC", "IRON", "RETICCTPCT" in the last 72 hours. Urine analysis:    Component Value Date/Time   COLORURINE YELLOW 08/07/2014 2046   APPEARANCEUR CLOUDY (A) 08/07/2014 2046   LABSPEC 1.011 08/07/2014 2046   PHURINE 7.0 08/07/2014 2046   GLUCOSEU NEGATIVE 08/07/2014 2046   HGBUR NEGATIVE 08/07/2014 2046   BILIRUBINUR NEGATIVE 08/07/2014 2046   KETONESUR NEGATIVE 08/07/2014 2046   PROTEINUR NEGATIVE 08/07/2014 2046   UROBILINOGEN 0.2 08/07/2014 2046   NITRITE NEGATIVE 08/07/2014 2046   LEUKOCYTESUR SMALL (A) 08/07/2014 2046    Radiological Exams on Admission: No results found.  Assessment and Plan  Acute GI bleed/hematochezia Hemoglobin 12.3 (was 16.0 on labs 3 months ago).  Bright red blood noted on rectal exam and FOBT positive.  Not on anticoagulation or antiplatelet agents.  Patient did  not have any further episodes of hematochezia in the ED and remains hemodynamically stable.  Source of bleeding is likely from the lower GI tract. BUN slightly elevated but patient has not had any hematemesis.  No prior EGD or colonoscopy done.  GI has been consulted.  Start IV Protonix 40 mg every 12 hours for now.  Keep n.p.o. and continue IV fluid hydration. Type and screen, continue to monitor H&H.  Syncope Likely orthostatic in the setting of acute blood loss.  No focal neurologic deficit on exam.  No longer feels dizzy or lightheaded after receiving IV fluids.  PE or ACS less likely as patient denies any chest pain or shortness of breath.  She is not hypoxic.  Stat EKG ordered.  Continue IV fluid hydration, fall precautions.  Hypertension Avoid antihypertensives at this time in the setting of acute GI bleed.  History of alcoholism Patient states she completely stopped drinking 6 weeks ago.  She has no signs of withdrawal on exam.  DVT prophylaxis: SCDs Code Status: Full Code (discussed with the patient) Consults called: GI Level of care: Progressive Care Unit Admission status: It is my clinical opinion that referral for OBSERVATION is reasonable and necessary in this patient based on the above information provided. The aforementioned taken together are felt to place the patient at high risk for further clinical deterioration. However, it is anticipated that the patient may be medically stable for discharge from the hospital within 24 to 48 hours.  John Giovanni MD [facility]  If 7PM-7AM, please contact night-coverage [email or web address]  04/19/2023, 2:29 AM

## 2023-04-20 ENCOUNTER — Observation Stay (HOSPITAL_COMMUNITY): Admitting: Anesthesiology

## 2023-04-20 ENCOUNTER — Other Ambulatory Visit (HOSPITAL_COMMUNITY): Payer: Self-pay

## 2023-04-20 ENCOUNTER — Encounter (HOSPITAL_COMMUNITY): Payer: Self-pay | Admitting: Internal Medicine

## 2023-04-20 ENCOUNTER — Encounter (HOSPITAL_COMMUNITY)
Admission: EM | Disposition: A | Discharge status: Home / Self Care | Payer: Self-pay | Source: Home / Self Care | Attending: Emergency Medicine

## 2023-04-20 DIAGNOSIS — G4733 Obstructive sleep apnea (adult) (pediatric): Secondary | ICD-10-CM | POA: Diagnosis not present

## 2023-04-20 DIAGNOSIS — K5730 Diverticulosis of large intestine without perforation or abscess without bleeding: Secondary | ICD-10-CM | POA: Diagnosis not present

## 2023-04-20 DIAGNOSIS — I10 Essential (primary) hypertension: Secondary | ICD-10-CM

## 2023-04-20 DIAGNOSIS — K922 Gastrointestinal hemorrhage, unspecified: Secondary | ICD-10-CM | POA: Diagnosis not present

## 2023-04-20 DIAGNOSIS — K920 Hematemesis: Secondary | ICD-10-CM | POA: Diagnosis not present

## 2023-04-20 HISTORY — PX: COLONOSCOPY: SHX5424

## 2023-04-20 LAB — COMPREHENSIVE METABOLIC PANEL
ALT: 15 U/L (ref 0–44)
AST: 14 U/L — ABNORMAL LOW (ref 15–41)
Albumin: 3 g/dL — ABNORMAL LOW (ref 3.5–5.0)
Alkaline Phosphatase: 51 U/L (ref 38–126)
Anion gap: 6 (ref 5–15)
BUN: 12 mg/dL (ref 8–23)
CO2: 24 mmol/L (ref 22–32)
Calcium: 8.7 mg/dL — ABNORMAL LOW (ref 8.9–10.3)
Chloride: 110 mmol/L (ref 98–111)
Creatinine, Ser: 0.54 mg/dL (ref 0.44–1.00)
GFR, Estimated: >60 mL/min (ref >60)
Glucose, Bld: 93 mg/dL (ref 70–99)
Potassium: 3.8 mmol/L (ref 3.5–5.1)
Sodium: 140 mmol/L (ref 135–145)
Total Bilirubin: 1 mg/dL (ref 0.0–1.2)
Total Protein: 5.4 g/dL — ABNORMAL LOW (ref 6.5–8.1)

## 2023-04-20 SURGERY — COLONOSCOPY
Anesthesia: Monitor Anesthesia Care | Laterality: Left

## 2023-04-20 MED ORDER — LIDOCAINE 2% (20 MG/ML) 5 ML SYRINGE
INTRAMUSCULAR | Status: DC | PRN
  Administered 2023-04-20: 60 mg via INTRAVENOUS

## 2023-04-20 MED ORDER — PROPOFOL 500 MG/50ML IV EMUL
INTRAVENOUS | Status: DC | PRN
  Administered 2023-04-20: 150 ug/kg/min via INTRAVENOUS

## 2023-04-20 MED ORDER — LISINOPRIL 20 MG PO TABS: 20.0000 mg | ORAL_TABLET | Freq: Every day | ORAL | Status: DC

## 2023-04-20 MED ORDER — FENTANYL CITRATE (PF) 100 MCG/2ML IJ SOLN
INTRAMUSCULAR | Status: AC
  Filled 2023-04-20: qty 2

## 2023-04-20 MED ORDER — SODIUM CHLORIDE 0.9 % IV SOLN: INTRAVENOUS | Status: DC | PRN

## 2023-04-20 MED ORDER — METOPROLOL TARTRATE 50 MG PO TABS
100.0000 mg | ORAL_TABLET | Freq: Two times a day (BID) | ORAL | Status: DC
  Administered 2023-04-20: 100 mg via ORAL
  Filled 2023-04-20: qty 2

## 2023-04-20 MED ORDER — LABETALOL HCL 5 MG/ML IV SOLN
10.0000 mg | INTRAVENOUS | Status: DC | PRN
  Administered 2023-04-20: 10 mg via INTRAVENOUS
  Filled 2023-04-20: qty 4

## 2023-04-20 MED ORDER — LISINOPRIL 20 MG PO TABS
20.0000 mg | ORAL_TABLET | Freq: Every day | ORAL | 0 refills | Status: DC
  Filled 2023-04-20: qty 60, 60d supply, fill #0

## 2023-04-20 MED ORDER — PROPOFOL 10 MG/ML IV BOLUS
INTRAVENOUS | Status: DC | PRN
  Administered 2023-04-20: 60 mg via INTRAVENOUS

## 2023-04-20 MED ORDER — PROPOFOL 10 MG/ML IV BOLUS
INTRAVENOUS | Status: AC
  Filled 2023-04-20: qty 20

## 2023-04-20 NOTE — Plan of Care (Signed)
  Problem: Education: Goal: Knowledge of General Education information will improve Description: Including pain rating scale, medication(s)/side effects and non-pharmacologic comfort measures 04/20/2023 1614 by Payton Spark, RN Outcome: Adequate for Discharge 04/20/2023 1522 by Payton Spark, RN Outcome: Progressing   Problem: Health Behavior/Discharge Planning: Goal: Ability to manage health-related needs will improve 04/20/2023 1614 by Payton Spark, RN Outcome: Adequate for Discharge 04/20/2023 1522 by Payton Spark, RN Outcome: Progressing   Problem: Clinical Measurements: Goal: Ability to maintain clinical measurements within normal limits will improve 04/20/2023 1614 by Payton Spark, RN Outcome: Adequate for Discharge 04/20/2023 1522 by Payton Spark, RN Outcome: Progressing Goal: Will remain free from infection 04/20/2023 1614 by Payton Spark, RN Outcome: Adequate for Discharge 04/20/2023 1522 by Payton Spark, RN Outcome: Progressing Goal: Diagnostic test results will improve 04/20/2023 1614 by Payton Spark, RN Outcome: Adequate for Discharge 04/20/2023 1522 by Payton Spark, RN Outcome: Progressing Goal: Respiratory complications will improve 04/20/2023 1614 by Payton Spark, RN Outcome: Adequate for Discharge 04/20/2023 1522 by Payton Spark, RN Outcome: Progressing Goal: Cardiovascular complication will be avoided 04/20/2023 1614 by Payton Spark, RN Outcome: Adequate for Discharge 04/20/2023 1522 by Payton Spark, RN Outcome: Progressing   Problem: Activity: Goal: Risk for activity intolerance will decrease 04/20/2023 1614 by Payton Spark, RN Outcome: Adequate for Discharge 04/20/2023 1522 by Payton Spark, RN Outcome: Progressing   Problem: Nutrition: Goal: Adequate nutrition will be maintained 04/20/2023 1614 by Payton Spark, RN Outcome: Adequate for Discharge 04/20/2023 1522 by Payton Spark, RN Outcome: Progressing   Problem:  Coping: Goal: Level of anxiety will decrease 04/20/2023 1614 by Payton Spark, RN Outcome: Adequate for Discharge 04/20/2023 1522 by Payton Spark, RN Outcome: Progressing   Problem: Elimination: Goal: Will not experience complications related to bowel motility 04/20/2023 1614 by Payton Spark, RN Outcome: Adequate for Discharge 04/20/2023 1522 by Payton Spark, RN Outcome: Progressing Goal: Will not experience complications related to urinary retention 04/20/2023 1614 by Payton Spark, RN Outcome: Adequate for Discharge 04/20/2023 1522 by Payton Spark, RN Outcome: Progressing   Problem: Pain Managment: Goal: General experience of comfort will improve and/or be controlled 04/20/2023 1614 by Payton Spark, RN Outcome: Adequate for Discharge 04/20/2023 1522 by Payton Spark, RN Outcome: Progressing   Problem: Safety: Goal: Ability to remain free from injury will improve 04/20/2023 1614 by Payton Spark, RN Outcome: Adequate for Discharge 04/20/2023 1522 by Payton Spark, RN Outcome: Progressing   Problem: Skin Integrity: Goal: Risk for impaired skin integrity will decrease 04/20/2023 1614 by Payton Spark, RN Outcome: Adequate for Discharge 04/20/2023 1522 by Payton Spark, RN Outcome: Progressing

## 2023-04-20 NOTE — Progress Notes (Signed)
 Medications delivered from [facility] outpatient pharmacy by this RN

## 2023-04-20 NOTE — Interval H&P Note (Signed)
 History and Physical Interval Note:  04/20/2023 1:47 PM  Wanda Dorsey  has presented today for surgery, with the diagnosis of Rectal bleeding..  The various methods of treatment have been discussed with the patient and family. After consideration of risks, benefits and other options for treatment, the patient has consented to  Procedure(s): COLONOSCOPY (Left) as a surgical intervention.  The patient's history has been reviewed, patient examined, no change in status, stable for surgery.  I have reviewed the patient's chart and labs.  Questions were answered to the patient's satisfaction.     Quamir Willemsen D

## 2023-04-20 NOTE — Plan of Care (Signed)

## 2023-04-20 NOTE — Op Note (Signed)
 [facility] Patient Name: Wanda Dorsey Procedure Date: 04/20/2023 MRN: [id number] Attending MD: Jeani Hawking , MD, [id number] Date of Birth: [birth year] CSN: [id number] Age: 78 Admit Type: Inpatient Procedure:                Colonoscopy Indications:              Hematochezia Providers:                Jeani Hawking, MD, Rogue Jury, RN, Marja Kays, Technician Referring MD:              Medicines:                Propofol per Anesthesia Complications:            No immediate complications. Estimated Blood Loss:     Estimated blood loss: none. Procedure:                After obtaining informed consent, the colonoscope                            was passed under direct vision. Throughout the                            procedure, the patient's blood pressure, pulse, and                            oxygen saturations were monitored continuously. The                            CF-HQ190L ([id number] Olympus colonoscope was                            introduced through the anus and advanced to the the                            cecum, identified by appendiceal orifice and                            ileocecal valve. The colonoscopy was somewhat                            difficult due to significant looping. Successful                            completion of the procedure was aided by using                            manual pressure and straightening and shortening                            the scope to obtain bowel loop reduction. The                            patient tolerated the procedure well.  The quality                            of the bowel preparation was evaluated using the                            BBPS [facility] Bowel Preparation Scale) with scores                            of: Right Colon = 2 (minor amount of residual                            staining, small fragments of stool and/or opaque                            liquid, but  mucosa seen well), Transverse Colon = 3                            (entire mucosa seen well with no residual staining,                            small fragments of stool or opaque liquid) and Left                            Colon = 2 (minor amount of residual staining, small                            fragments of stool and/or opaque liquid, but mucosa                            seen well). The total BBPS score equals 7. The                            quality of the bowel preparation was good. The                            ileocecal valve, appendiceal orifice, and rectum                            were photographed. Scope In: 2:10:53 PM Scope Out: 2:29:55 PM Scope Withdrawal Time: 0 hours 9 minutes 16 seconds  Total Procedure Duration: 0 hours 19 minutes 2 seconds  Findings:      Scattered large-mouthed, medium-mouthed and small-mouthed diverticula       were found in the sigmoid colon.      The patient most likely experienced a diverticular bleed. Impression:               - Diverticulosis in the sigmoid colon.                           - No specimens collected. Moderate Sedation:      Not Applicable - Patient had care per Anesthesia. Recommendation:           - Return patient to hospital  ward for ongoing care.                           - Resume regular diet.                           - Continue present medications.                           - Follow up with PCP.                           - No repeat colonoscopy recommended.                           - Signing off. Procedure Code(s):        --- Professional ---                           [phone], Colonoscopy, flexible; diagnostic, including                            collection of specimen(s) by brushing or washing,                            when performed (separate procedure) Diagnosis Code(s):        --- Professional ---                           K92.1, Melena (includes Hematochezia)                           K57.30,  Diverticulosis of large intestine without                            perforation or abscess without bleeding CPT copyright 2022 American [employer]. All rights reserved. The codes documented in this report are preliminary and upon coder review may  be revised to meet current compliance requirements. Jeani Hawking, MD Jeani Hawking, MD 04/20/2023 2:34:58 PM This report has been signed electronically. Number of Addenda: 0

## 2023-04-20 NOTE — Anesthesia Preprocedure Evaluation (Addendum)
 Anesthesia Evaluation  Patient identified by MRN, date of birth, ID band Patient awake    Reviewed: Allergy & Precautions, NPO status , Patient's Chart, lab work & pertinent test results, reviewed documented beta blocker date and time   Airway Mallampati: IV  TM Distance: >3 FB Neck ROM: Full    Dental  (+) Teeth Intact, Dental Advisory Given, Chipped,    Pulmonary sleep apnea , former smoker Sleep study positive, refused CPAP Was using oxygen at night for a while after COVID a few years ago   Pulmonary exam normal breath sounds clear to auscultation       Cardiovascular hypertension (159/88 preop, per pt normally 120s SBP), Pt. on medications and Pt. on home beta blockers Normal cardiovascular exam Rhythm:Regular Rate:Normal     Neuro/Psych negative neurological ROS  negative psych ROS   GI/Hepatic ,,,(+)     substance abuse (remote hx)  alcohol useRectal bleeding    Endo/Other  negative endocrine ROS    Renal/GU negative Renal ROS  negative genitourinary   Musculoskeletal negative musculoskeletal ROS (+)    Abdominal   Peds  Hematology Hb 12.4, plt 207   Anesthesia Other Findings   Reproductive/Obstetrics negative OB ROS                             Anesthesia Physical Anesthesia Plan  ASA: 2  Anesthesia Plan: MAC   Post-op Pain Management:    Induction:   PONV Risk Score and Plan: 2 and Propofol infusion and TIVA  Airway Management Planned: Natural Airway and Simple Face Mask  Additional Equipment: None  Intra-op Plan:   Post-operative Plan:   Informed Consent: I have reviewed the patients History and Physical, chart, labs and discussed the procedure including the risks, benefits and alternatives for the proposed anesthesia with the patient or authorized representative who has indicated his/her understanding and acceptance.       Plan Discussed with:  CRNA  Anesthesia Plan Comments:        Anesthesia Quick Evaluation

## 2023-04-20 NOTE — Discharge Summary (Signed)
 Physician Discharge Summary  Wanda Dorsey ZOX:[id number] DOB: [birth year] DOA: 04/18/2023  PCP: Salli Real, MD  Admit date: 04/18/2023 Discharge date: 04/20/2023  Admitted From: Home Disposition:  Home  Discharge Condition:Stable CODE STATUS:FULL Diet recommendation: Heart Healthy  Brief/Interim Summary: Patient is a 78 year old female with history of hypertension,  prior alcohol abuse, breast cancer status post right mastectomy in 2010 who presented for the evaluation of rectal bleeding.  On presentation, she was hemodynamically stable.  Labs showed hemoglobin of 12.3.  Hemoglobin was 16 about 3 months ago.  GI consulted for Dr. Nicholes Mango group.  Admitted for the observation/further management of hematochezia.  No further episodes of hematochezia after admission.  She underwent colonoscopy, found to have diverticulosis without any active bleeding.  Hemoglobin remained stable.  GI cleared for status.  Medically stable for discharge home today.     Discharge Diagnoses:  Principal Problem:   Acute lower GI bleeding Active Problems:   Syncope   Essential hypertension    Discharge Instructions  Discharge Instructions     Diet - low sodium heart healthy   Complete by: As directed    Discharge instructions   Complete by: As directed    1)Please follow up with your PCP in a week 2)Please monitor your blood pressure at home   Increase activity slowly   Complete by: As directed       Allergies as of 04/20/2023       Reactions   Other Itching   Tree nuts- itching Hot peppers- jalapeno, etc..- get rash around mouth    Penicillins Rash, Other (See Comments)   Reaction: unknown  Has patient had a PCN reaction causing immediate rash, facial/tongue/throat swelling, SOB or lightheadedness with hypotension: unknown Has patient had a PCN reaction causing severe rash involving mucus membranes or skin necrosis: unknown Has patient had a PCN reaction that required hospitalization: no Has  patient had a PCN reaction occurring within the last 10 years: no If all of the above answers are "NO", then may proceed with Cephalosporin use.        Medication List     TAKE these medications    anastrozole 1 MG tablet Commonly known as: ARIMIDEX Take 1 mg by mouth daily.   cetirizine 10 MG tablet Commonly known as: ZYRTEC Take 10 mg by mouth daily as needed for allergies.   cholecalciferol 1000 units tablet Commonly known as: VITAMIN D Take 5,000 Units by mouth daily.   cloNIDine 0.1 MG tablet Commonly known as: CATAPRES Take 0.1 mg by mouth every 6 (six) hours as needed.   lisinopril 20 MG tablet Commonly known as: ZESTRIL Take 1 tablet (20 mg total) by mouth daily.   metoprolol tartrate 100 MG tablet Commonly known as: LOPRESSOR Take 100 mg by mouth 2 (two) times daily.   multivitamin with minerals Tabs tablet Take 1 tablet by mouth daily.   naproxen sodium 220 MG tablet Commonly known as: ALEVE Take 220 mg by mouth 2 (two) times daily as needed (pain).   ProAir RespiClick 108 (90 Base) MCG/ACT Aepb Generic drug: Albuterol Sulfate Take 2 puffs by mouth 4 (four) times daily as needed (sob/wheezing).        Follow-up Information     Salli Real, MD. Schedule an appointment as soon as possible for a visit in 1 week(s).   Specialty: Internal Medicine Contact information: [address] [place] [address] [phone]  Allergies  Allergen Reactions   Other Itching    Tree nuts- itching Hot peppers- jalapeno, etc..- get rash around mouth    Penicillins Rash and Other (See Comments)    Reaction: unknown  Has patient had a PCN reaction causing immediate rash, facial/tongue/throat swelling, SOB or lightheadedness with hypotension: unknown Has patient had a PCN reaction causing severe rash involving mucus membranes or skin necrosis: unknown Has patient had a PCN reaction that required hospitalization: no Has patient had a PCN  reaction occurring within the last 10 years: no If all of the above answers are "NO", then may proceed with Cephalosporin use.     Consultations: GI   Procedures/Studies: No results found.    Subjective: Patient seen and examined at bedside today.  Hemodynamically stable for discharge  Discharge Exam: Vitals:   04/20/23 1450 04/20/23 1500  BP: 135/74 (!) 169/83  Pulse: 72 70  Resp: 16 18  Temp:    SpO2: 96% 97%   Vitals:   04/20/23 1435 04/20/23 1440 04/20/23 1450 04/20/23 1500  BP: (!) 106/58 (!) 116/57 135/74 (!) 169/83  Pulse: 72 66 72 70  Resp: 13 (!) 25 16 18   Temp: 98 F (36.7 C)     TempSrc: Temporal     SpO2: 100% 100% 96% 97%  Weight:      Height:        General: Pt is alert, awake, not in acute distress Cardiovascular: RRR, S1/S2 +, no rubs, no gallops Respiratory: CTA bilaterally, no wheezing, no rhonchi Abdominal: Soft, NT, ND, bowel sounds + Extremities: no edema, no cyanosis    The results of significant diagnostics from this hospitalization (including imaging, microbiology, ancillary and laboratory) are listed below for reference.     Microbiology: Recent Results (from the past 240 hours)  MRSA Next Gen by PCR, Nasal     Status: None   Collection Time: 04/19/23  3:29 PM   Specimen: Nasal Mucosa; Nasal Swab  Result Value Ref Range Status   MRSA by PCR Next Gen NOT DETECTED NOT DETECTED Final    Comment: (NOTE) The GeneXpert MRSA Assay (FDA approved for NASAL specimens only), is one component of a comprehensive MRSA colonization surveillance program. It is not intended to diagnose MRSA infection nor to guide or monitor treatment for MRSA infections. Test performance is not FDA approved in patients less than 10 years old. Performed at [facility], [address] [place], [place] [address]      Labs: BNP (last 3 results) Recent Labs    01/14/23 0931  BNP 111.0*   Basic Metabolic Panel: Recent Labs  Lab  04/18/23 2240 04/20/23 0834  NA 140 140  K 4.1 3.8  CL 108 110  CO2 26 24  GLUCOSE 134* 93  BUN 26* 12  CREATININE 0.69 0.54  CALCIUM 8.9 8.7*   Liver Function Tests: Recent Labs  Lab 04/20/23 0834  AST 14*  ALT 15  ALKPHOS 51  BILITOT 1.0  PROT 5.4*  ALBUMIN 3.0*   No results for input(s): "LIPASE", "AMYLASE" in the last 168 hours. No results for input(s): "AMMONIA" in the last 168 hours. CBC: Recent Labs  Lab 04/18/23 2240 04/19/23 0411 04/19/23 0903 04/19/23 2103  WBC 10.4  --   --   --   NEUTROABS 5.3  --   --   --   HGB 12.3 11.3* 10.8* 12.4  HCT 38.5 35.3* 34.5* 38.4  MCV 97.7  --   --   --  PLT 207  --   --   --    Cardiac Enzymes: No results for input(s): "CKTOTAL", "CKMB", "CKMBINDEX", "TROPONINI" in the last 168 hours. BNP: Invalid input(s): "POCBNP" CBG: No results for input(s): "GLUCAP" in the last 168 hours. D-Dimer No results for input(s): "DDIMER" in the last 72 hours. Hgb A1c No results for input(s): "HGBA1C" in the last 72 hours. Lipid Profile No results for input(s): "CHOL", "HDL", "LDLCALC", "TRIG", "CHOLHDL", "LDLDIRECT" in the last 72 hours. Thyroid function studies No results for input(s): "TSH", "T4TOTAL", "T3FREE", "THYROIDAB" in the last 72 hours.  Invalid input(s): "FREET3" Anemia work up No results for input(s): "VITAMINB12", "FOLATE", "FERRITIN", "TIBC", "IRON", "RETICCTPCT" in the last 72 hours. Urinalysis    Component Value Date/Time   COLORURINE YELLOW 08/07/2014 2046   APPEARANCEUR CLOUDY (A) 08/07/2014 2046   LABSPEC 1.011 08/07/2014 2046   PHURINE 7.0 08/07/2014 2046   GLUCOSEU NEGATIVE 08/07/2014 2046   HGBUR NEGATIVE 08/07/2014 2046   BILIRUBINUR NEGATIVE 08/07/2014 2046   KETONESUR NEGATIVE 08/07/2014 2046   PROTEINUR NEGATIVE 08/07/2014 2046   UROBILINOGEN 0.2 08/07/2014 2046   NITRITE NEGATIVE 08/07/2014 2046   LEUKOCYTESUR SMALL (A) 08/07/2014 2046   Sepsis Labs Recent Labs  Lab 04/18/23 2240  WBC  10.4   Microbiology Recent Results (from the past 240 hours)  MRSA Next Gen by PCR, Nasal     Status: None   Collection Time: 04/19/23  3:29 PM   Specimen: Nasal Mucosa; Nasal Swab  Result Value Ref Range Status   MRSA by PCR Next Gen NOT DETECTED NOT DETECTED Final    Comment: (NOTE) The GeneXpert MRSA Assay (FDA approved for NASAL specimens only), is one component of a comprehensive MRSA colonization surveillance program. It is not intended to diagnose MRSA infection nor to guide or monitor treatment for MRSA infections. Test performance is not FDA approved in patients less than 30 years old. Performed at [facility], [address] [place], [place] [address]     Please note: You were cared for by a hospitalist during your hospital stay. Once you are discharged, your primary care physician will handle any further medical issues. Please note that NO REFILLS for any discharge medications will be authorized once you are discharged, as it is imperative that you return to your primary care physician (or establish a relationship with a primary care physician if you do not have one) for your post hospital discharge needs so that they can reassess your need for medications and monitor your lab values.    Time coordinating discharge: 40 minutes  SIGNED:   Burnadette Pop, MD  [facility] 04/20/2023, 3:45 PM Pager [phone]  If 7PM-7AM, please contact night-coverage [email or web address] [facility] Physician Discharge Summary  Wanda Dorsey ION:[id number] DOB: 1945/10/13 DOA: 04/18/2023  PCP: Salli Real, MD  Admit date: 04/18/2023 Discharge date: 04/20/2023  Admitted From: Home Disposition:  Home  Discharge Condition:Stable CODE STATUS:FULL, DNR, Comfort Care Diet recommendation: Heart Healthy / Carb Modified / Regular / Dysphagia   Brief/Interim Summary:   Following problems were addressed during the hospitalization:   Discharge Diagnoses:  Principal  Problem:   Acute lower GI bleeding Active Problems:   Syncope   Essential hypertension    Discharge Instructions  Discharge Instructions     Diet - low sodium heart healthy   Complete by: As directed    Discharge instructions   Complete by: As directed    1)Please follow up with your PCP in a week 2)Please  monitor your blood pressure at home   Increase activity slowly   Complete by: As directed       Allergies as of 04/20/2023       Reactions   Other Itching   Tree nuts- itching Hot peppers- jalapeno, etc..- get rash around mouth    Penicillins Rash, Other (See Comments)   Reaction: unknown  Has patient had a PCN reaction causing immediate rash, facial/tongue/throat swelling, SOB or lightheadedness with hypotension: unknown Has patient had a PCN reaction causing severe rash involving mucus membranes or skin necrosis: unknown Has patient had a PCN reaction that required hospitalization: no Has patient had a PCN reaction occurring within the last 10 years: no If all of the above answers are "NO", then may proceed with Cephalosporin use.        Medication List     TAKE these medications    anastrozole 1 MG tablet Commonly known as: ARIMIDEX Take 1 mg by mouth daily.   cetirizine 10 MG tablet Commonly known as: ZYRTEC Take 10 mg by mouth daily as needed for allergies.   cholecalciferol 1000 units tablet Commonly known as: VITAMIN D Take 5,000 Units by mouth daily.   cloNIDine 0.1 MG tablet Commonly known as: CATAPRES Take 0.1 mg by mouth every 6 (six) hours as needed.   lisinopril 20 MG tablet Commonly known as: ZESTRIL Take 1 tablet (20 mg total) by mouth daily.   metoprolol tartrate 100 MG tablet Commonly known as: LOPRESSOR Take 100 mg by mouth 2 (two) times daily.   multivitamin with minerals Tabs tablet Take 1 tablet by mouth daily.   naproxen sodium 220 MG tablet Commonly known as: ALEVE Take 220 mg by mouth 2 (two) times daily as needed  (pain).   ProAir RespiClick 108 (90 Base) MCG/ACT Aepb Generic drug: Albuterol Sulfate Take 2 puffs by mouth 4 (four) times daily as needed (sob/wheezing).        Follow-up Information     Salli Real, MD. Schedule an appointment as soon as possible for a visit in 1 week(s).   Specialty: Internal Medicine Contact information: [address] [place] [address] [phone]                Allergies  Allergen Reactions   Other Itching    Tree nuts- itching Hot peppers- jalapeno, etc..- get rash around mouth    Penicillins Rash and Other (See Comments)    Reaction: unknown  Has patient had a PCN reaction causing immediate rash, facial/tongue/throat swelling, SOB or lightheadedness with hypotension: unknown Has patient had a PCN reaction causing severe rash involving mucus membranes or skin necrosis: unknown Has patient had a PCN reaction that required hospitalization: no Has patient had a PCN reaction occurring within the last 10 years: no If all of the above answers are "NO", then may proceed with Cephalosporin use.     Consultations:    Procedures/Studies: No results found.    Subjective:   Discharge Exam: Vitals:   04/20/23 1450 04/20/23 1500  BP: 135/74 (!) 169/83  Pulse: 72 70  Resp: 16 18  Temp:    SpO2: 96% 97%   Vitals:   04/20/23 1435 04/20/23 1440 04/20/23 1450 04/20/23 1500  BP: (!) 106/58 (!) 116/57 135/74 (!) 169/83  Pulse: 72 66 72 70  Resp: 13 (!) 25 16 18   Temp: 98 F (36.7 C)     TempSrc: Temporal     SpO2: 100% 100% 96% 97%  Weight:  Height:        General: Pt is alert, awake, not in acute distress Cardiovascular: RRR, S1/S2 +, no rubs, no gallops Respiratory: CTA bilaterally, no wheezing, no rhonchi Abdominal: Soft, NT, ND, bowel sounds + Extremities: no edema, no cyanosis    The results of significant diagnostics from this hospitalization (including imaging, microbiology, ancillary and laboratory) are listed  below for reference.     Microbiology: Recent Results (from the past 240 hours)  MRSA Next Gen by PCR, Nasal     Status: None   Collection Time: 04/19/23  3:29 PM   Specimen: Nasal Mucosa; Nasal Swab  Result Value Ref Range Status   MRSA by PCR Next Gen NOT DETECTED NOT DETECTED Final    Comment: (NOTE) The GeneXpert MRSA Assay (FDA approved for NASAL specimens only), is one component of a comprehensive MRSA colonization surveillance program. It is not intended to diagnose MRSA infection nor to guide or monitor treatment for MRSA infections. Test performance is not FDA approved in patients less than 65 years old. Performed at [facility], [address] [place], [place] [address]      Labs: BNP (last 3 results) Recent Labs    01/14/23 0931  BNP 111.0*   Basic Metabolic Panel: Recent Labs  Lab 04/18/23 2240 04/20/23 0834  NA 140 140  K 4.1 3.8  CL 108 110  CO2 26 24  GLUCOSE 134* 93  BUN 26* 12  CREATININE 0.69 0.54  CALCIUM 8.9 8.7*   Liver Function Tests: Recent Labs  Lab 04/20/23 0834  AST 14*  ALT 15  ALKPHOS 51  BILITOT 1.0  PROT 5.4*  ALBUMIN 3.0*   No results for input(s): "LIPASE", "AMYLASE" in the last 168 hours. No results for input(s): "AMMONIA" in the last 168 hours. CBC: Recent Labs  Lab 04/18/23 2240 04/19/23 0411 04/19/23 0903 04/19/23 2103  WBC 10.4  --   --   --   NEUTROABS 5.3  --   --   --   HGB 12.3 11.3* 10.8* 12.4  HCT 38.5 35.3* 34.5* 38.4  MCV 97.7  --   --   --   PLT 207  --   --   --    Cardiac Enzymes: No results for input(s): "CKTOTAL", "CKMB", "CKMBINDEX", "TROPONINI" in the last 168 hours. BNP: Invalid input(s): "POCBNP" CBG: No results for input(s): "GLUCAP" in the last 168 hours. D-Dimer No results for input(s): "DDIMER" in the last 72 hours. Hgb A1c No results for input(s): "HGBA1C" in the last 72 hours. Lipid Profile No results for input(s): "CHOL", "HDL", "LDLCALC", "TRIG",  "CHOLHDL", "LDLDIRECT" in the last 72 hours. Thyroid function studies No results for input(s): "TSH", "T4TOTAL", "T3FREE", "THYROIDAB" in the last 72 hours.  Invalid input(s): "FREET3" Anemia work up No results for input(s): "VITAMINB12", "FOLATE", "FERRITIN", "TIBC", "IRON", "RETICCTPCT" in the last 72 hours. Urinalysis    Component Value Date/Time   COLORURINE YELLOW 08/07/2014 2046   APPEARANCEUR CLOUDY (A) 08/07/2014 2046   LABSPEC 1.011 08/07/2014 2046   PHURINE 7.0 08/07/2014 2046   GLUCOSEU NEGATIVE 08/07/2014 2046   HGBUR NEGATIVE 08/07/2014 2046   BILIRUBINUR NEGATIVE 08/07/2014 2046   KETONESUR NEGATIVE 08/07/2014 2046   PROTEINUR NEGATIVE 08/07/2014 2046   UROBILINOGEN 0.2 08/07/2014 2046   NITRITE NEGATIVE 08/07/2014 2046   LEUKOCYTESUR SMALL (A) 08/07/2014 2046   Sepsis Labs Recent Labs  Lab 04/18/23 2240  WBC 10.4   Microbiology Recent Results (from the past 240 hours)  MRSA  Next Gen by PCR, Nasal     Status: None   Collection Time: 04/19/23  3:29 PM   Specimen: Nasal Mucosa; Nasal Swab  Result Value Ref Range Status   MRSA by PCR Next Gen NOT DETECTED NOT DETECTED Final    Comment: (NOTE) The GeneXpert MRSA Assay (FDA approved for NASAL specimens only), is one component of a comprehensive MRSA colonization surveillance program. It is not intended to diagnose MRSA infection nor to guide or monitor treatment for MRSA infections. Test performance is not FDA approved in patients less than 53 years old. Performed at [facility], [address] [place], [place] [address]     Please note: You were cared for by a hospitalist during your hospital stay. Once you are discharged, your primary care physician will handle any further medical issues. Please note that NO REFILLS for any discharge medications will be authorized once you are discharged, as it is imperative that you return to your primary care physician (or establish a relationship with  a primary care physician if you do not have one) for your post hospital discharge needs so that they can reassess your need for medications and monitor your lab values.    Time coordinating discharge: 40 minutes  SIGNED:   Burnadette Pop, MD  [facility] 04/20/2023, 3:45 PM Pager [phone]  If 7PM-7AM, please contact night-coverage [email or web address] Password [email or web address]

## 2023-04-20 NOTE — Care Management Obs Status (Signed)
 MEDICARE OBSERVATION STATUS NOTIFICATION   Patient Details  Name: Wanda Dorsey MRN: [id number] Date of Birth: May 10, 1945   Medicare Observation Status Notification Given:  Yes    Larrie Kass, LCSW 04/20/2023, 9:05 AM

## 2023-04-20 NOTE — Transfer of Care (Signed)
 Immediate Anesthesia Transfer of Care Note  Patient: Wanda Dorsey  Procedure(s) Performed: COLONOSCOPY (Left)  Patient Location: Endoscopy Unit  Anesthesia Type:General  Level of Consciousness: sedated  Airway & Oxygen Therapy: Patient Spontanous Breathing and Patient connected to face mask oxygen  Post-op Assessment: Report given to RN and Post -op Vital signs reviewed and stable  Post vital signs: Reviewed and stable  Last Vitals:  Vitals Value Taken Time  BP 106/58 04/20/23 1435  Temp    Pulse 71 04/20/23 1437  Resp 22 04/20/23 1437  SpO2 100 % 04/20/23 1437  Vitals shown include unfiled device data.  Last Pain:  Vitals:   04/20/23 1318  TempSrc:   PainSc: 0-No pain         Complications: No notable events documented.

## 2023-04-20 NOTE — Anesthesia Postprocedure Evaluation (Signed)
 Anesthesia Post Note  Patient: Wanda Dorsey  Procedure(s) Performed: COLONOSCOPY (Left)     Patient location during evaluation: Endoscopy Anesthesia Type: MAC Level of consciousness: awake and alert, patient cooperative and oriented Pain management: pain level controlled Vital Signs Assessment: post-procedure vital signs reviewed and stable Respiratory status: nonlabored ventilation, spontaneous breathing and respiratory function stable Cardiovascular status: blood pressure returned to baseline and stable Postop Assessment: no apparent nausea or vomiting, adequate PO intake and able to ambulate Anesthetic complications: no   No notable events documented.  Last Vitals:  Vitals:   04/20/23 1450 04/20/23 1500  BP: 135/74 (!) 169/83  Pulse: 72 70  Resp: 16 18  Temp:    SpO2: 96% 97%    Last Pain:  Vitals:   04/20/23 1500  TempSrc:   PainSc: 0-No pain                 Rogen Porte,E. Carlson Belland

## 2023-04-23 ENCOUNTER — Encounter (HOSPITAL_COMMUNITY): Payer: Self-pay | Admitting: Gastroenterology

## 2024-01-01 ENCOUNTER — Encounter (HOSPITAL_COMMUNITY): Payer: Self-pay

## 2024-01-01 ENCOUNTER — Other Ambulatory Visit: Payer: Self-pay

## 2024-01-01 ENCOUNTER — Emergency Department (HOSPITAL_COMMUNITY)

## 2024-01-01 ENCOUNTER — Inpatient Hospital Stay (HOSPITAL_COMMUNITY)
Admission: EM | Admit: 2024-01-01 | Discharge: 2024-01-05 | DRG: 286 | Disposition: A | Discharge status: Home Health | Attending: Emergency Medicine | Admitting: Emergency Medicine

## 2024-01-01 ENCOUNTER — Inpatient Hospital Stay (HOSPITAL_COMMUNITY)

## 2024-01-01 DIAGNOSIS — Z9221 Personal history of antineoplastic chemotherapy: Secondary | ICD-10-CM | POA: Diagnosis not present

## 2024-01-01 DIAGNOSIS — R5382 Chronic fatigue, unspecified: Secondary | ICD-10-CM | POA: Diagnosis present

## 2024-01-01 DIAGNOSIS — I951 Orthostatic hypotension: Secondary | ICD-10-CM | POA: Diagnosis present

## 2024-01-01 DIAGNOSIS — I459 Conduction disorder, unspecified: Secondary | ICD-10-CM | POA: Diagnosis present

## 2024-01-01 DIAGNOSIS — Z79899 Other long term (current) drug therapy: Secondary | ICD-10-CM

## 2024-01-01 DIAGNOSIS — F10930 Alcohol use, unspecified with withdrawal, uncomplicated: Secondary | ICD-10-CM | POA: Diagnosis not present

## 2024-01-01 DIAGNOSIS — Z91018 Allergy to other foods: Secondary | ICD-10-CM

## 2024-01-01 DIAGNOSIS — I2510 Atherosclerotic heart disease of native coronary artery without angina pectoris: Secondary | ICD-10-CM | POA: Diagnosis present

## 2024-01-01 DIAGNOSIS — J9601 Acute respiratory failure with hypoxia: Principal | ICD-10-CM | POA: Diagnosis present

## 2024-01-01 DIAGNOSIS — Z79811 Long term (current) use of aromatase inhibitors: Secondary | ICD-10-CM | POA: Diagnosis not present

## 2024-01-01 DIAGNOSIS — R7401 Elevation of levels of liver transaminase levels: Secondary | ICD-10-CM | POA: Diagnosis present

## 2024-01-01 DIAGNOSIS — Z853 Personal history of malignant neoplasm of breast: Secondary | ICD-10-CM

## 2024-01-01 DIAGNOSIS — F10939 Alcohol use, unspecified with withdrawal, unspecified: Secondary | ICD-10-CM

## 2024-01-01 DIAGNOSIS — I5020 Unspecified systolic (congestive) heart failure: Secondary | ICD-10-CM | POA: Diagnosis not present

## 2024-01-01 DIAGNOSIS — E876 Hypokalemia: Secondary | ICD-10-CM | POA: Diagnosis present

## 2024-01-01 DIAGNOSIS — D696 Thrombocytopenia, unspecified: Secondary | ICD-10-CM | POA: Diagnosis present

## 2024-01-01 DIAGNOSIS — J449 Chronic obstructive pulmonary disease, unspecified: Secondary | ICD-10-CM | POA: Diagnosis present

## 2024-01-01 DIAGNOSIS — I10 Essential (primary) hypertension: Secondary | ICD-10-CM | POA: Diagnosis not present

## 2024-01-01 DIAGNOSIS — R7989 Other specified abnormal findings of blood chemistry: Secondary | ICD-10-CM | POA: Diagnosis not present

## 2024-01-01 DIAGNOSIS — I426 Alcoholic cardiomyopathy: Secondary | ICD-10-CM | POA: Diagnosis present

## 2024-01-01 DIAGNOSIS — R9431 Abnormal electrocardiogram [ECG] [EKG]: Secondary | ICD-10-CM

## 2024-01-01 DIAGNOSIS — I110 Hypertensive heart disease with heart failure: Secondary | ICD-10-CM | POA: Diagnosis present

## 2024-01-01 DIAGNOSIS — I509 Heart failure, unspecified: Secondary | ICD-10-CM

## 2024-01-01 DIAGNOSIS — E038 Other specified hypothyroidism: Secondary | ICD-10-CM | POA: Diagnosis present

## 2024-01-01 DIAGNOSIS — F10230 Alcohol dependence with withdrawal, uncomplicated: Secondary | ICD-10-CM | POA: Diagnosis present

## 2024-01-01 DIAGNOSIS — Z9010 Acquired absence of unspecified breast and nipple: Secondary | ICD-10-CM

## 2024-01-01 DIAGNOSIS — Z880 Allergy status to penicillin: Secondary | ICD-10-CM

## 2024-01-01 DIAGNOSIS — I5041 Acute combined systolic (congestive) and diastolic (congestive) heart failure: Secondary | ICD-10-CM | POA: Diagnosis present

## 2024-01-01 DIAGNOSIS — D72829 Elevated white blood cell count, unspecified: Secondary | ICD-10-CM | POA: Diagnosis not present

## 2024-01-01 DIAGNOSIS — Z87891 Personal history of nicotine dependence: Secondary | ICD-10-CM | POA: Diagnosis not present

## 2024-01-01 LAB — RESPIRATORY PANEL BY PCR

## 2024-01-01 LAB — TROPONIN T, HIGH SENSITIVITY
Troponin T High Sensitivity: 19 ng/L (ref 0–19)
Troponin T High Sensitivity: 21 ng/L — ABNORMAL HIGH (ref 0–19)

## 2024-01-01 LAB — I-STAT CHEM 8, ED
BUN: 29 mg/dL — ABNORMAL HIGH (ref 8–23)
Calcium, Ion: 1.12 mmol/L — ABNORMAL LOW (ref 1.15–1.40)
Chloride: 107 mmol/L (ref 98–111)
Creatinine, Ser: 0.7 mg/dL (ref 0.44–1.00)
Glucose, Bld: 129 mg/dL — ABNORMAL HIGH (ref 70–99)
HCT: 46 % (ref 36.0–46.0)
Hemoglobin: 15.6 g/dL — ABNORMAL HIGH (ref 12.0–15.0)
Potassium: 5.3 mmol/L — ABNORMAL HIGH (ref 3.5–5.1)
Sodium: 139 mmol/L (ref 135–145)
TCO2: 25 mmol/L (ref 22–32)

## 2024-01-01 LAB — CBC
HCT: 45.5 % (ref 36.0–46.0)
HCT: 43.3 % (ref 36.0–46.0)
Hemoglobin: 14.9 g/dL (ref 12.0–15.0)
Hemoglobin: 14.1 g/dL (ref 12.0–15.0)
MCH: 32.7 pg (ref 26.0–34.0)
MCH: 32.3 pg (ref 26.0–34.0)
MCHC: 32.7 g/dL (ref 30.0–36.0)
MCHC: 32.6 g/dL (ref 30.0–36.0)
MCV: 99.8 fL (ref 80.0–100.0)
MCV: 99.1 fL (ref 80.0–100.0)
Platelets: 115 K/uL — ABNORMAL LOW (ref 150–400)
Platelets: 112 K/uL — ABNORMAL LOW (ref 150–400)
RBC: 4.56 MIL/uL (ref 3.87–5.11)
RBC: 4.37 MIL/uL (ref 3.87–5.11)
RDW: 14.6 % (ref 11.5–15.5)
RDW: 14.3 % (ref 11.5–15.5)
WBC: 12.2 K/uL — ABNORMAL HIGH (ref 4.0–10.5)
WBC: 9.2 K/uL (ref 4.0–10.5)
nRBC: 0 % (ref 0.0–0.2)
nRBC: 0 % (ref 0.0–0.2)

## 2024-01-01 LAB — COMPREHENSIVE METABOLIC PANEL WITH GFR
ALT: 70 U/L — ABNORMAL HIGH (ref 0–44)
AST: 101 U/L — ABNORMAL HIGH (ref 15–41)
Albumin: 3.9 g/dL (ref 3.5–5.0)
Alkaline Phosphatase: 91 U/L (ref 38–126)
Anion gap: 11 (ref 5–15)
BUN: 20 mg/dL (ref 8–23)
CO2: 28 mmol/L (ref 22–32)
Calcium: 9.5 mg/dL (ref 8.9–10.3)
Chloride: 104 mmol/L (ref 98–111)
Creatinine, Ser: 0.68 mg/dL (ref 0.44–1.00)
GFR, Estimated: >60 mL/min (ref >60)
Glucose, Bld: 126 mg/dL — ABNORMAL HIGH (ref 70–99)
Potassium: 4 mmol/L (ref 3.5–5.1)
Sodium: 143 mmol/L (ref 135–145)
Total Bilirubin: 0.9 mg/dL (ref 0.0–1.2)
Total Protein: 7 g/dL (ref 6.5–8.1)

## 2024-01-01 LAB — BLOOD GAS, VENOUS
Acid-Base Excess: 3.1 mmol/L — ABNORMAL HIGH (ref 0.0–2.0)
Bicarbonate: 29.1 mmol/L — ABNORMAL HIGH (ref 20.0–28.0)
O2 Saturation: 40.2 %
Patient temperature: 37
pCO2, Ven: 48 mmHg (ref 44–60)
pH, Ven: 7.39 (ref 7.25–7.43)
pO2, Ven: <31 mmHg — CL (ref 32–45)

## 2024-01-01 LAB — ECHOCARDIOGRAM COMPLETE
Area-P 1/2: 3.07 cm2
Calc EF: 30.2 %
Height: 62 in
S' Lateral: 3.8 cm
Single Plane A2C EF: 16.5 %
Single Plane A4C EF: 37.5 %
Weight: 2179.91 [oz_av]

## 2024-01-01 LAB — LIPID PANEL
Cholesterol: 178 mg/dL (ref 0–200)
HDL: 92 mg/dL (ref >40)
LDL Cholesterol: 75 mg/dL (ref 0–99)
Total CHOL/HDL Ratio: 1.9 ratio
Triglycerides: 53 mg/dL (ref <150)
VLDL: 11 mg/dL (ref 0–40)

## 2024-01-01 LAB — I-STAT CG4 LACTIC ACID, ED: Lactic Acid, Venous: 1.3 mmol/L (ref 0.5–1.9)

## 2024-01-01 LAB — RESP PANEL BY RT-PCR (RSV, FLU A&B, COVID)  RVPGX2
Influenza A by PCR: NEGATIVE
Influenza B by PCR: NEGATIVE
Resp Syncytial Virus by PCR: NEGATIVE
SARS Coronavirus 2 by RT PCR: NEGATIVE

## 2024-01-01 LAB — CREATININE, SERUM
Creatinine, Ser: 0.55 mg/dL (ref 0.44–1.00)
GFR, Estimated: >60 mL/min (ref >60)

## 2024-01-01 LAB — ETHANOL: Alcohol, Ethyl (B): <15 mg/dL (ref <15)

## 2024-01-01 LAB — URINE DRUG SCREEN
Amphetamines: NEGATIVE
Barbiturates: NEGATIVE
Benzodiazepines: POSITIVE — AB
Cocaine: NEGATIVE
Fentanyl: NEGATIVE
Methadone Scn, Ur: NEGATIVE
Opiates: NEGATIVE
Tetrahydrocannabinol: NEGATIVE

## 2024-01-01 LAB — PRO BRAIN NATRIURETIC PEPTIDE: Pro Brain Natriuretic Peptide: 3902 pg/mL — ABNORMAL HIGH (ref <300.0)

## 2024-01-01 LAB — MRSA NEXT GEN BY PCR, NASAL: MRSA by PCR Next Gen: NOT DETECTED

## 2024-01-01 MED ORDER — FUROSEMIDE 10 MG/ML IJ SOLN
20.0000 mg | Freq: Three times a day (TID) | INTRAMUSCULAR | Status: DC
  Administered 2024-01-01 – 2024-01-03 (×5): 20 mg via INTRAVENOUS
  Filled 2024-01-01 (×5): qty 2

## 2024-01-01 MED ORDER — FREE WATER: 250.0000 mL | Freq: Once | Status: DC

## 2024-01-01 MED ORDER — LORAZEPAM 2 MG/ML IJ SOLN: 1.0000 mg | INTRAMUSCULAR | Status: DC | PRN

## 2024-01-01 MED ORDER — THIAMINE HCL 100 MG/ML IJ SOLN
200.0000 mg | INTRAVENOUS | Status: DC
  Administered 2024-01-02: 200 mg via INTRAVENOUS
  Filled 2024-01-01 (×5): qty 2

## 2024-01-01 MED ORDER — POLYETHYLENE GLYCOL 3350 17 G PO PACK: 17.0000 g | PACK | Freq: Every day | ORAL | Status: DC | PRN

## 2024-01-01 MED ORDER — SODIUM CHLORIDE 0.9 % IV SOLN: 250.0000 mL | INTRAVENOUS | Status: DC | PRN

## 2024-01-01 MED ORDER — ENOXAPARIN SODIUM 40 MG/0.4ML IJ SOSY
40.0000 mg | PREFILLED_SYRINGE | INTRAMUSCULAR | Status: DC
  Administered 2024-01-01 – 2024-01-04 (×4): 40 mg via SUBCUTANEOUS
  Filled 2024-01-01 (×4): qty 0.4

## 2024-01-01 MED ORDER — DOCUSATE SODIUM 100 MG PO CAPS
100.0000 mg | ORAL_CAPSULE | Freq: Two times a day (BID) | ORAL | Status: DC | PRN
  Administered 2024-01-03 – 2024-01-05 (×2): 100 mg via ORAL
  Filled 2024-01-01 (×2): qty 1

## 2024-01-01 MED ORDER — LORAZEPAM 2 MG/ML IJ SOLN
2.0000 mg | Freq: Once | INTRAMUSCULAR | Status: AC
  Administered 2024-01-01: 2 mg via INTRAVENOUS
  Filled 2024-01-01: qty 1

## 2024-01-01 MED ORDER — SODIUM CHLORIDE 0.9 % IV SOLN
1.0000 g | Freq: Once | INTRAVENOUS | Status: AC
  Administered 2024-01-01: 1 g via INTRAVENOUS
  Filled 2024-01-01: qty 10

## 2024-01-01 MED ORDER — METHYLPREDNISOLONE SODIUM SUCC 125 MG IJ SOLR
125.0000 mg | Freq: Once | INTRAMUSCULAR | Status: AC
  Administered 2024-01-01: 125 mg via INTRAVENOUS
  Filled 2024-01-01: qty 2

## 2024-01-01 MED ORDER — PERFLUTREN LIPID MICROSPHERE
1.0000 mL | INTRAVENOUS | Status: AC | PRN
  Administered 2024-01-01: 2 mL via INTRAVENOUS

## 2024-01-01 MED ORDER — ORAL CARE MOUTH RINSE: 15.0000 mL | OROMUCOSAL | Status: DC | PRN

## 2024-01-01 MED ORDER — IPRATROPIUM-ALBUTEROL 0.5-2.5 (3) MG/3ML IN SOLN
3.0000 mL | Freq: Once | RESPIRATORY_TRACT | Status: AC
  Administered 2024-01-01: 3 mL via RESPIRATORY_TRACT
  Filled 2024-01-01: qty 3

## 2024-01-01 MED ORDER — THIAMINE MONONITRATE 100 MG PO TABS
100.0000 mg | ORAL_TABLET | ORAL | Status: DC
  Administered 2024-01-01 – 2024-01-05 (×4): 100 mg via ORAL
  Filled 2024-01-01 (×4): qty 1

## 2024-01-01 MED ORDER — ALBUTEROL SULFATE (2.5 MG/3ML) 0.083% IN NEBU: 3.0000 mL | INHALATION_SOLUTION | Freq: Four times a day (QID) | RESPIRATORY_TRACT | Status: DC | PRN

## 2024-01-01 MED ORDER — FLUTICASONE FUROATE-VILANTEROL 100-25 MCG/ACT IN AEPB
1.0000 | INHALATION_SPRAY | Freq: Every day | RESPIRATORY_TRACT | Status: DC
  Administered 2024-01-01 – 2024-01-05 (×3): 1 via RESPIRATORY_TRACT
  Filled 2024-01-01 (×2): qty 28

## 2024-01-01 MED ORDER — METOPROLOL SUCCINATE ER 25 MG PO TB24
100.0000 mg | ORAL_TABLET | Freq: Every day | ORAL | Status: DC
  Administered 2024-01-01: 100 mg via ORAL
  Filled 2024-01-01: qty 4

## 2024-01-01 MED ORDER — FUROSEMIDE 10 MG/ML IJ SOLN
20.0000 mg | Freq: Once | INTRAMUSCULAR | Status: AC
  Administered 2024-01-01: 20 mg via INTRAVENOUS
  Filled 2024-01-01: qty 2

## 2024-01-01 MED ORDER — SODIUM CHLORIDE 0.9% FLUSH: 3.0000 mL | INTRAVENOUS | Status: DC | PRN

## 2024-01-01 MED ORDER — IPRATROPIUM-ALBUTEROL 0.5-2.5 (3) MG/3ML IN SOLN: 3.0000 mL | Freq: Four times a day (QID) | RESPIRATORY_TRACT | Status: DC | PRN

## 2024-01-01 MED ORDER — SODIUM CHLORIDE 0.9% FLUSH
3.0000 mL | Freq: Two times a day (BID) | INTRAVENOUS | Status: DC
  Administered 2024-01-01 – 2024-01-02 (×2): 3 mL via INTRAVENOUS

## 2024-01-01 MED ORDER — ASPIRIN 81 MG PO CHEW
81.0000 mg | CHEWABLE_TABLET | ORAL | Status: AC
  Administered 2024-01-02: 81 mg via ORAL
  Filled 2024-01-01: qty 1

## 2024-01-01 MED ORDER — LISINOPRIL 10 MG PO TABS
10.0000 mg | ORAL_TABLET | Freq: Every day | ORAL | Status: DC
  Administered 2024-01-01: 10 mg via ORAL
  Filled 2024-01-01: qty 1

## 2024-01-01 MED ORDER — CHLORHEXIDINE GLUCONATE CLOTH 2 % EX PADS
6.0000 | MEDICATED_PAD | Freq: Every day | CUTANEOUS | Status: DC
  Administered 2024-01-01 – 2024-01-02 (×2): 6 via TOPICAL

## 2024-01-01 MED ORDER — FOLIC ACID 1 MG PO TABS
1.0000 mg | ORAL_TABLET | Freq: Every day | ORAL | Status: DC
  Administered 2024-01-01 – 2024-01-05 (×5): 1 mg via ORAL
  Filled 2024-01-01 (×5): qty 1

## 2024-01-01 MED ORDER — DOXYCYCLINE HYCLATE 100 MG IV SOLR
100.0000 mg | Freq: Two times a day (BID) | INTRAVENOUS | Status: DC
  Administered 2024-01-01 – 2024-01-03 (×5): 100 mg via INTRAVENOUS
  Filled 2024-01-01 (×8): qty 100

## 2024-01-01 MED ORDER — SODIUM CHLORIDE 0.9 % IV SOLN
100.0000 mg | Freq: Once | INTRAVENOUS | Status: AC
  Administered 2024-01-01: 100 mg via INTRAVENOUS
  Filled 2024-01-01: qty 100

## 2024-01-01 MED ORDER — FUROSEMIDE 10 MG/ML IJ SOLN
20.0000 mg | Freq: Once | INTRAMUSCULAR | Status: AC
  Administered 2024-01-01: 20 mg via INTRAVENOUS
  Filled 2024-01-01: qty 4

## 2024-01-01 MED ORDER — ADULT MULTIVITAMIN W/MINERALS CH
1.0000 | ORAL_TABLET | Freq: Every day | ORAL | Status: DC
  Administered 2024-01-01 – 2024-01-05 (×4): 1 via ORAL
  Filled 2024-01-01 (×4): qty 1

## 2024-01-01 MED ORDER — IPRATROPIUM-ALBUTEROL 0.5-2.5 (3) MG/3ML IN SOLN
3.0000 mL | Freq: Four times a day (QID) | RESPIRATORY_TRACT | Status: DC
  Administered 2024-01-01: 3 mL via RESPIRATORY_TRACT
  Filled 2024-01-01: qty 3

## 2024-01-01 NOTE — ED Notes (Signed)
 RT at bedside.

## 2024-01-01 NOTE — Progress Notes (Signed)
 Interval CCM Note:  78yoF with history of breast cancer s/p mastectomy 2010 on anastrozole , hypertension, alcohol use disorder, recent admission 3/4-04/20/2023 for rectal bleeding found to have diverticulosis who presents to [facility] ED 11/17 AM with shortness of breath. On my exam she states about 1 week of worsening shortness of breath. She has chronic cough that she states has been unchanged. She denies fever, chills, abdominal pain, n/v/d, urinary symptoms. Denies lower extremity swelling. She does say she feels a bit bloated. Heavy alcohol use history and states last drink was 11/14 0030. She has been on a Valium  taper that she states she was put on at [facility]. She denies having any withdrawal symptoms at this time.   ED labs with significant BNP ~3900. She does not see a cardiologist. Troponin 20s. CXR with edema. Covid/flu/rsv negative. Placed on bipap and admitted to ICU  Exam: Older female laying in bed, nad  Anicteric sclera, mm dry, nasal cannula  Crackles bases, 2lnc, no respiratory distress  S1s2, no murmur, no peripheral edema, warm  Abdomen rounded, ntnd   Lab and imaging review: WBC 12, plt 115, AST 101, ALT 70, BNP 3902, troponin 19>21  Lactic negative  CXR with pulmonary edema   Assessment/Plan  Heart failure, unclear chronicity  Does not follow with cardiology in outpatient setting. Not on diuretic, takes ACE and BB at home. Unclear underlying cause although she has heavy alcohol use history. More remotely was on Doxorubicin chemotherapy in 1999.  - consult cardiology  - f/u echocardiogram  - repeat lasix 20mg  IV now (20 in ED earlier) for 40 total  - obtain lipid panel  - tele monitoring  - daily weight  - PRN BiPAP, currently oxygenating well without SOB on 2LNC   Acute hypoxic respiratory distress 2/2 pulmonary edema  - O2 for sat >92%  - will use Breo Ellipta inhaler here at home substitute  - okay for doxycycline coverage for now - feel less likely at this time  infection  - prn duoneb  - pulse oximetry monitoring   Alcohol use disorder  Alcohol withdrawal  Thrombocytopenia  Transaminitis  Has been detoxing at home with Valium  reportedly from [facility]. No current symptoms.  - ativan  prn  - will order RUQ US  while she is here  - folic acid, mtvn, thiamine  - counsel on cessation   Leukocytosis  - wbc 12  - trend, monitor fever curve  - doxycycline for CAP coverage, low threshold to dc though   History of hypertension  - start home meds at half dose  - metoprolol  100 XL  - lisinopril  10mg  daily  - can increase both as needed to home dosing   Critical care time: 62  The patient is critically ill with multiple organ system failure and requires high complexity decision making for assessment and support, frequent evaluation and titration of therapies, advanced monitoring, review of radiographic studies and interpretation of complex data.    Critical Care Time devoted to patient care services, exclusive of separately billable procedures, described in this note is 40  Tinnie FORBES Adolph DEVONNA [facility] 01/01/24 9:37 AM  Please see [email or web address] for pager details.  From 7A-7P if no response, please call [phone] After hours, please call ELink [phone]

## 2024-01-01 NOTE — Consult Note (Signed)
 Cardiology Consultation   Patient ID: Wanda Dorsey MRN: [id number]; DOB: Feb 16, 1945  Admit date: 01/01/2024 Date of Consult: 01/01/2024  PCP:  Austin Mutton, MD   [facility] Providers Cardiologist:  Joelle VEAR Ren Donley, MD      Patient Profile: Wanda Dorsey is a 78 y.o. female with a hx of hypertension, alcohol abuse, OSA, prior smoker, breast cancer s/p mastectomy n 2010 and diverticulosis who is being seen 01/01/2024 for the evaluation of heart failure at the request of Lonna Coder MD.  History of Present Illness: [name] is a 78 year old female who per chart review has not previously been seen by cardiology.  Patient presented to the emergency department for worsening shortness of breath.  On interview patient reported that she had shortness of breath and worsening agitation after starting alcohol detox on Friday, 14th of November.  Also reported having worsening dyspnea on exertion, orthopnea, dizziness, and difficulty sleeping.  Denies any lower extremity edema but does feel like abdomen is more distended.  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  She is usually able to go up and down the stairs to the second story, and carry the laundry.  She has not been able to be as active since the symptoms started this past Friday.  Denies any fever, chills, diaphoresis, chest pain, nausea, and vomiting.  Denies having any history of heart attack, stroke, atrial fibrillation, or heart failure.  Denies illicit substance use.  Labs showed elevated proBNP of 3902, elevated high-sensitivity troponins 19 >21, negative respiratory panel, WBC count of 9.2, hemoglobin of 14.1, elevated potassium of 5.3, sodium of 139, creatinine of 0.70, and elevated BUN of 29.  Chest x-ray showed generalized interstitial consolidation consistent with edema, and increased central vascular prominence.  EKG showed normal sinus rhythm with a rate of 97, intraventricular  conduction delay, possible anterior septal infarct.  Echo pending  Past Medical History:  Diagnosis Date   Cancer [facility])    Hearing loss    Hypertension     Past Surgical History:  Procedure Laterality Date   BREAST SURGERY     CESAREAN SECTION     COLONOSCOPY Left 04/20/2023   Procedure: COLONOSCOPY;  Surgeon: Rollin Dover, MD;  Location: [facility];  Service: Gastroenterology;  Laterality: Left;     Home Medications:  Prior to Admission medications   Medication Sig Start Date End Date Taking? Authorizing Provider  anastrozole  (ARIMIDEX ) 1 MG tablet Take 1 mg by mouth daily.  04/10/11  Yes [provider]  BREYNA 160-4.5 MCG/ACT inhaler Inhale 2 puffs into the lungs 2 (two) times daily. 11/10/23  Yes [provider]  cholecalciferol  (VITAMIN D ) 1000 UNITS tablet Take 5,000 Units by mouth daily.   Yes [provider]  cloNIDine  (CATAPRES ) 0.1 MG tablet Take 0.1 mg by mouth every 6 (six) hours as needed.   Yes [provider]  ipratropium-albuterol  (DUONEB) 0.5-2.5 (3) MG/3ML SOLN Take 3 mLs by nebulization. Every 6-8 Hours PRN 07/30/23  Yes [provider]  lisinopril  (ZESTRIL ) 20 MG tablet Take 1 tablet (20 mg total) by mouth daily. 04/20/23  Yes Jillian Buttery, MD  LORazepam  (ATIVAN ) 0.5 MG tablet Take 0.5 mg by mouth 3 (three) times daily as needed. 07/07/23  Yes [provider]  metoprolol  (TOPROL -XL) 200 MG 24 hr tablet Take 200 mg by mouth daily. 10/03/23  Yes [provider]  PROAIR  RESPICLICK 108 (90 Base) MCG/ACT AEPB Take 2 puffs by mouth  4 (four) times daily as needed (sob/wheezing).  05/03/19  Yes [provider]  thiamine (VITAMIN B1) 100 MG tablet Take 200 mg by mouth daily.   Yes [provider]  cetirizine (ZYRTEC) 10 MG tablet Take 10 mg by mouth daily as needed for allergies. Patient not taking: Reported on 01/01/2024    [provider]  metoprolol  tartrate (LOPRESSOR ) 100 MG tablet  Take 100 mg by mouth 2 (two) times daily. Patient not taking: Reported on 01/01/2024    [provider]  Multiple Vitamin (MULTIVITAMIN WITH MINERALS) TABS tablet Take 1 tablet by mouth daily. Patient not taking: Reported on 01/01/2024    [provider]  naproxen sodium (ALEVE) 220 MG tablet Take 220 mg by mouth 2 (two) times daily as needed (pain).    [provider]    Scheduled Meds:  Chlorhexidine Gluconate Cloth  6 each Topical Daily   enoxaparin (LOVENOX) injection  40 mg Subcutaneous Q24H   fluticasone furoate-vilanterol  1 puff Inhalation Daily   folic acid  1 mg Oral Daily   lisinopril   10 mg Oral Daily   metoprolol   100 mg Oral Daily   multivitamin with minerals  1 tablet Oral Daily   thiamine  100 mg Oral Q24H   Continuous Infusions:  doxycycline (VIBRAMYCIN) IV 100 mg (01/01/24 1039)   thiamine (VITAMIN B1) injection     PRN Meds: albuterol , docusate sodium, ipratropium-albuterol , LORazepam , polyethylene glycol  Allergies:    Allergies  Allergen Reactions   Other Itching    Tree nuts- itching Hot peppers- jalapeno, etc..- get rash around mouth    Penicillins Rash and Other (See Comments)    Reaction: unknown  Has patient had a PCN reaction causing immediate rash, facial/tongue/throat swelling, SOB or lightheadedness with hypotension: unknown Has patient had a PCN reaction causing severe rash involving mucus membranes or skin necrosis: unknown Has patient had a PCN reaction that required hospitalization: no Has patient had a PCN reaction occurring within the last 10 years: no If all of the above answers are NO, then may proceed with Cephalosporin use.     Social History:   Social History   Socioeconomic History   Marital status: Married    Spouse name: Not on file   Number of children: Not on file   Years of education: Not on file   Highest education level: Not on file  Occupational History   Not on file  Tobacco Use    Smoking status: Former   Smokeless tobacco: Never  Substance and Sexual Activity   Alcohol use: Yes    Comment: 80 proff vodka approx 20 oz daily    Drug use: No   Sexual activity: Not on file  Other Topics Concern   Not on file  Social History Narrative   Not on file   Social Drivers of Health   Financial Resource Strain: Not on file  Food Insecurity: No Food Insecurity (04/19/2023)   Hunger Vital Sign    Worried About Running Out of Food in the Last Year: Never true    Ran Out of Food in the Last Year: Never true  Transportation Needs: No Transportation Needs (04/19/2023)   PRAPARE - Administrator, Civil Service (Medical): No    Lack of Transportation (Non-Medical): No  Physical Activity: Not on file  Stress: Not on file  Social Connections: Socially Integrated (04/19/2023)   Social Connection and Isolation Panel    Frequency of Communication with Friends and  Family: More than three times a week    Frequency of Social Gatherings with Friends and Family: Three times a week    Attends Religious Services: More than 4 times per year    Active Member of Clubs or Organizations: No    Attends Banker Meetings: More than 4 times per year    Marital Status: Married  Catering Manager Violence: Not At Risk (04/19/2023)   Humiliation, Afraid, Rape, and Kick questionnaire    Fear of Current or Ex-Partner: No    Emotionally Abused: No    Physically Abused: No    Sexually Abused: No    Family History:   History reviewed. No pertinent family history.   ROS:  Please see the history of present illness.   All other ROS reviewed and negative.     Physical Exam/Data: Vitals:   01/01/24 0900 01/01/24 0923 01/01/24 1000 01/01/24 1029  BP: (!) 133/115  127/70 127/70  Pulse: 84  82 82  Resp: (!) 23  (!) 21   Temp: 97.8 F (36.6 C)     TempSrc: Oral     SpO2: 95% 95% 96%   Weight: 61.8 kg     Height: 5' 2 (1.575 m)       Intake/Output Summary (Last 24 hours) at  01/01/2024 1056 Last data filed at 01/01/2024 0900 Gross per 24 hour  Intake --  Output 50 ml  Net -50 ml      01/01/2024    9:00 AM 04/18/2023   10:53 PM 02/21/2023   12:02 PM  Last 3 Weights  Weight (lbs) 136 lb 3.9 oz 133 lb 8 oz 145 lb  Weight (kg) 61.8 kg 60.555 kg 65.772 kg     Body mass index is 24.92 kg/m.  General:  Well nourished, well developed, in no acute distress.  On 2 L nasal cannula.  Alert and orientated. HEENT: normal Neck: no JVD Vascular: No carotid bruits; Distal pulses 2+ bilaterally Cardiac:  normal S1, S2; RRR; no murmur Lungs: Bibasilar crackles. Abd: Slightly hard and distended. Ext: no edema Musculoskeletal:  No deformities Skin: warm and dry  Neuro:  no focal abnormalities noted Psych:  Normal affect   EKG:  The EKG was personally reviewed and demonstrates:  normal sinus rhythm with a rate of 97, intraventricular conduction delay, possible anterior septal infarct. Telemetry:  Telemetry was personally reviewed and demonstrates: Normal sinus rhythm with heart rates in the 70s to 80s.  Relevant CV Studies: Echo pending  Laboratory Data: High Sensitivity Troponin:  No results for input(s): TROPONINIHS in the last 720 hours.   Chemistry Recent Labs  Lab 01/01/24 0353 01/01/24 0426 01/01/24 0603  NA 143 139  --   K 4.0 5.3*  --   CL 104 107  --   CO2 28  --   --   GLUCOSE 126* 129*  --   BUN 20 29*  --   CREATININE 0.68 0.70 0.55  CALCIUM 9.5  --   --   GFRNONAA >60  --  >60  ANIONGAP 11  --   --     Recent Labs  Lab 01/01/24 0353  PROT 7.0  ALBUMIN 3.9  AST 101*  ALT 70*  ALKPHOS 91  BILITOT 0.9   Lipids No results for input(s): CHOL, TRIG, HDL, LABVLDL, LDLCALC, CHOLHDL in the last 168 hours.  Hematology Recent Labs  Lab 01/01/24 0353 01/01/24 0426 01/01/24 0958  WBC 12.2*  --  9.2  RBC 4.56  --  4.37  HGB 14.9 15.6* 14.1  HCT 45.5 46.0 43.3  MCV 99.8  --  99.1  MCH 32.7  --  32.3  MCHC 32.7  --  32.6   RDW 14.6  --  14.3  PLT 115*  --  112*   Thyroid No results for input(s): TSH, FREET4 in the last 168 hours.  BNP Recent Labs  Lab 01/01/24 0403  PROBNP 3,902.0*    DDimer No results for input(s): DDIMER in the last 168 hours.  Radiology/Studies:  DG Chest Port 1 View Result Date: 01/01/2024 EXAM: 1 VIEW(S) XRAY OF THE CHEST 01/01/2024 03:58:33 AM COMPARISON: Portable chest and CTA chest both 01/14/2023. CLINICAL HISTORY: SOB SOB FINDINGS: LUNGS AND PLEURA: Increased central vascular prominence. Generalized interstitial consolidation consistent with edema. Ill-defined right upper lobe perihilar architectural distortion approaching 2.6 cm question vascular summation versus parenchymal mass. Small pleural effusions. No pneumothorax. HEART AND MEDIASTINUM: Stable mediastinum. Aortic tortuosity and atherosclerosis. BONES AND SOFT TISSUES: Right axillary surgical clips. Healed fractures of the right anterior third through fifth ribs with hypertrophic callus, previously seen and unchanged. No new osseous findings. IMPRESSION: 1. Chf or fluid overload  with vascular congestion and interstitial edema. . 2. Small pleural effusions. 3. Progress chest films recommended depending on clinical response. 4. Right upper lobe perihilar architectural distortion/mass or vascular summation artifact. Attention on follow-up film recommended. Electronically signed by: Francis Quam MD 01/01/2024 04:13 AM EST RP Workstation: [id number]     Assessment and Plan:  RAI SINAGRA is a 78 y.o. female with a hx of hypertension, alcohol abuse, OSA, prior smoker, breast cancer s/p mastectomy n 2010 and diverticulosis who is being seen 01/01/2024 for the evaluation of heart failure at the request of Praveen Mannam MD.  New onset heart failure The patient had shortness of breath and worsening agitation after starting alcohol detox on Friday, 14th of November.  Also reported having worsening dyspnea on exertion,  orthopnea, dizziness, and difficulty sleeping.  Denies any lower extremity edema but does feel like abdomen is more distended.  Labs showed elevated proBNP of 3902 Chest x-ray showed generalized interstitial consolidation consistent with edema, and increased central vascular prominence. Echo pending. Start IV Lasix 20 mg 3 times daily.   Abnormal EKG Coronary calcifications The patient had a CTA done on 12/2022.  Reviewing it there does appear to be a few diffuse coronary calcifications. EKG showed normal sinus rhythm with a rate of 97, intraventricular conduction delay, possible anterior septal infarct. On interview patient denies any chest pain within the last 2 to 3 months. Denies having any history of heart attack, stroke, atrial fibrillation, or heart failure. Depending on echocardiogram findings we will consider inpatient versus outpatient ischemic evaluation.   Elevated high-sensitivity troponins 19 >21 Suspect elevated and essentially flat high-sensitivity troponins are likely secondary to demand ischemia from heart failure.  Will plan ischemic evaluation as mentioned above   Hypertension Most recent blood pressure at about 10:30 AM this morning was 127/70. Continue lisinopril  10 mg daily Continue metoprolol  100 mg daily   Otherwise management per primary   Risk Assessment/Risk Scores:     [place]  Heart Association (NYHA) Functional Class NYHA Class III       For questions or updates, please contact [facility] Please consult [email or web address] for contact info under     Signed, Morse Clause, PA-C  01/01/2024 10:56 AM

## 2024-01-01 NOTE — Evaluation (Signed)
 Clinical/Bedside Swallow Evaluation Patient Details  Name: Wanda Dorsey MRN: [id number] Date of Birth: 30-Oct-1945  Today's Date: 01/01/2024 Time: SLP Start Time (ACUTE ONLY): 1345 SLP Stop Time (ACUTE ONLY): 1404 SLP Time Calculation (min) (ACUTE ONLY): 19 min  Past Medical History:  Past Medical History:  Diagnosis Date   Cancer (HCC)    Hearing loss    Hypertension    Past Surgical History:  Past Surgical History:  Procedure Laterality Date   BREAST SURGERY     CESAREAN SECTION     COLONOSCOPY Left 04/20/2023   Procedure: COLONOSCOPY;  Surgeon: Rollin Dover, MD;  Location: [facility];  Service: Gastroenterology;  Laterality: Left;   HPI:  Patient is a 78 y.o. female with PMH: breast cancer, prior GI bleed, HTN, COPD, heart failure, GERD, ETOH abuse(1/2 bottle of vodka per day). She presented to the hospital on 01/01/2024 with acutely worsening of chronic SOB. Patient reported that SOB correlated with timing of her decision to abstain from ETOH use. She has been using Valium  to assist with ETOH weaning. In ED, she was placed on BiPAP but this was made PRN and she has been tolerating supplemental oxygen at 2L. CT angio/chest/PE showed 3mm nodule in left lower lobe, negative for PE, no consolidative process noted. SLP swallow evaluation ordered after RN observed patient to have coughing when taking medications with liquids.    Assessment / Plan / Recommendation  Clinical Impression  Patient is presenting with questionable s/s of a pharyngoesophageal dysphagia as per this evaluation, per patient's report and chart review. She has a h/o GERD and she herself indicates that the booze does make her GERD worse. She denies any other GERD triggers such as spicy foods, acidic foods. She told SLP that she was told before that I might have a stricture. SLP did not observe any overt s/s aspiration with thin liquid sips or puree solids, but she did exhibit multiple swallows, intermittent globus  sensation. SLP recommending initiate PO diet of regular solids, thin liquids and will follow briefly for toleration. SLP Visit Diagnosis: Dysphagia, unspecified (R13.10)    Aspiration Risk  Mild aspiration risk    Diet Recommendation Regular;Thin liquid    Liquid Administration via: Straw;Cup Medication Administration: Other (Comment) (as tolerated; in pudding/puree per patient preference) Supervision: Patient able to self feed Compensations: Slow rate;Small sips/bites Postural Changes: Seated upright at 90 degrees;Remain upright for at least 30 minutes after po intake    Other  Recommendations Oral Care Recommendations: Oral care BID     Assistance Recommended at Discharge    Functional Status Assessment Patient has had a recent decline in their functional status and demonstrates the ability to make significant improvements in function in a reasonable and predictable amount of time.  Frequency and Duration min 1 x/week  1 week       Prognosis Prognosis for improved oropharyngeal function: Good      Swallow Study   General Date of Onset: 01/01/24 HPI: Patient is a 78 y.o. female with PMH: breast cancer, prior GI bleed, HTN, COPD, heart failure, GERD, ETOH abuse(1/2 bottle of vodka per day). She presented to the hospital on 01/01/2024 with acutely worsening of chronic SOB. Patient reported that SOB correlated with timing of her decision to abstain from ETOH use. She has been using Valium  to assist with ETOH weaning. In ED, she was placed on BiPAP but this was made PRN and she has been tolerating supplemental oxygen at 2L. CT angio/chest/PE showed 3mm nodule in left  lower lobe, negative for PE, no consolidative process noted. SLP swallow evaluation ordered after RN observed patient to have coughing when taking medications with liquids. Type of Study: Bedside Swallow Evaluation Previous Swallow Assessment: none found Diet Prior to this Study: NPO Temperature Spikes Noted:  No Respiratory Status: Nasal cannula History of Recent Intubation: No Behavior/Cognition: Alert;Cooperative;Pleasant mood Oral Cavity Assessment: Within Functional Limits Oral Care Completed by SLP: No Oral Cavity - Dentition: Adequate natural dentition Vision: Functional for self-feeding Self-Feeding Abilities: Able to feed self Patient Positioning: Upright in bed Baseline Vocal Quality: Normal Volitional Cough: Strong Volitional Swallow: Able to elicit    Oral/Motor/Sensory Function Overall Oral Motor/Sensory Function: Within functional limits   Ice Chips     Thin Liquid Thin Liquid: Impaired Presentation: Straw;Self Fed Pharyngeal  Phase Impairments: Multiple swallows    Nectar Thick     Honey Thick     Puree Puree: Impaired Presentation: Self Fed;Spoon Pharyngeal Phase Impairments: Multiple swallows   Solid     Solid: Not tested     Norleen IVAR Blase, MA, CCC-SLP Speech Therapy

## 2024-01-01 NOTE — Progress Notes (Signed)
  Echocardiogram 2D Echocardiogram has been performed.  Wanda Dorsey 01/01/2024, 2:00 PM

## 2024-01-01 NOTE — Progress Notes (Signed)
 RT took pt off BIPAP and placed on 2 LPM [place]. Pt could tell RT where she was at. Pt doing well at this time.

## 2024-01-01 NOTE — ED Provider Notes (Signed)
 [place] EMERGENCY DEPARTMENT AT [facility] Provider Note   CSN: [id number] Arrival date & time: 01/01/24  9673     Patient presents with: Shortness of Breath   Wanda Dorsey is a 78 y.o. female.   The history is provided by the patient, the EMS personnel and medical records.  Shortness of Breath Wanda Dorsey is a 78 y.o. female who presents to the Emergency Department complaining of shortness of breath.  She presents to the emergency department by EMS for evaluation of difficulty breathing.  She reports chronic shortness of breath but worsened on Friday when she decided to start detoxing from alcohol.  She has been taking Valium  on a 4-day detox protocol.  She reports that her shortness of breath has progressively worsened.  No fever, chest pain, cough, vomiting, leg swelling.  She typically drinks half a bottle of vodka daily she states that she is slowly drinks it throughout the day.  Her last drink was at 1230 on Thursday.  She has a history of breast cancer, prior GI bleed, hypertension, COPD.  She is not on oxygen at home.      Prior to Admission medications   Medication Sig Start Date End Date Taking? Authorizing Provider  anastrozole  (ARIMIDEX ) 1 MG tablet Take 1 mg by mouth daily.  04/10/11  Yes [provider]  BREYNA 160-4.5 MCG/ACT inhaler Inhale 2 puffs into the lungs 2 (two) times daily. 11/10/23  Yes [provider]  cholecalciferol  (VITAMIN D ) 1000 UNITS tablet Take 5,000 Units by mouth daily.   Yes [provider]  cloNIDine  (CATAPRES ) 0.1 MG tablet Take 0.1 mg by mouth every 6 (six) hours as needed.   Yes [provider]  ipratropium-albuterol  (DUONEB) 0.5-2.5 (3) MG/3ML SOLN Take 3 mLs by nebulization. Every 6-8 Hours PRN 07/30/23  Yes [provider]  lisinopril  (ZESTRIL ) 20 MG tablet Take 1 tablet (20 mg total) by mouth daily. 04/20/23  Yes Jillian Buttery, MD  LORazepam  (ATIVAN ) 0.5 MG tablet Take 0.5 mg by  mouth 3 (three) times daily as needed. 07/07/23  Yes [provider]  metoprolol  (TOPROL -XL) 200 MG 24 hr tablet Take 200 mg by mouth daily. 10/03/23  Yes [provider]  PROAIR  RESPICLICK 108 (90 Base) MCG/ACT AEPB Take 2 puffs by mouth 4 (four) times daily as needed (sob/wheezing).  05/03/19  Yes [provider]  thiamine (VITAMIN B1) 100 MG tablet Take 200 mg by mouth daily.   Yes [provider]  cetirizine (ZYRTEC) 10 MG tablet Take 10 mg by mouth daily as needed for allergies. Patient not taking: Reported on 01/01/2024    [provider]  metoprolol  tartrate (LOPRESSOR ) 100 MG tablet Take 100 mg by mouth 2 (two) times daily. Patient not taking: Reported on 01/01/2024    [provider]  Multiple Vitamin (MULTIVITAMIN WITH MINERALS) TABS tablet Take 1 tablet by mouth daily. Patient not taking: Reported on 01/01/2024    [provider]  naproxen sodium (ALEVE) 220 MG tablet Take 220 mg by mouth 2 (two) times daily as needed (pain).    [provider]    Allergies: Other and Penicillins    Review of Systems  Respiratory:  Positive for shortness of breath.   All other systems reviewed and are negative.   Updated Vital Signs BP (!) 129/92   Pulse 87   Temp 98 F (36.7 C)   Resp (!) 30   SpO2 94%   Physical Exam Vitals and  nursing note reviewed.  Constitutional:      Appearance: She is well-developed.  HENT:     Head: Normocephalic and atraumatic.  Cardiovascular:     Rate and Rhythm: Regular rhythm. Tachycardia present.     Heart sounds: No murmur heard. Pulmonary:     Comments: Tachypnea, decreased air movement bilaterally with occasional fine crackles Abdominal:     Palpations: Abdomen is soft.     Tenderness: There is no abdominal tenderness. There is no guarding or rebound.  Musculoskeletal:        General: No tenderness.  Skin:    General: Skin is warm and dry.     Capillary Refill: Capillary  refill takes 2 to 3 seconds.  Neurological:     Mental Status: She is alert and oriented to person, place, and time.     Comments: Tremulous and anxious  Psychiatric:        Behavior: Behavior normal.     (all labs ordered are listed, but only abnormal results are displayed) Labs Reviewed  COMPREHENSIVE METABOLIC PANEL WITH GFR - Abnormal; Notable for the following components:      Result Value   Glucose, Bld 126 (*)    AST 101 (*)    ALT 70 (*)    All other components within normal limits  CBC - Abnormal; Notable for the following components:   WBC 12.2 (*)    Platelets 115 (*)    All other components within normal limits  PRO BRAIN NATRIURETIC PEPTIDE - Abnormal; Notable for the following components:   Pro Brain Natriuretic Peptide 3,902.0 (*)    All other components within normal limits  BLOOD GAS, VENOUS - Abnormal; Notable for the following components:   pO2, Ven <31 (*)    Bicarbonate 29.1 (*)    Acid-Base Excess 3.1 (*)    All other components within normal limits  I-STAT CHEM 8, ED - Abnormal; Notable for the following components:   Potassium 5.3 (*)    BUN 29 (*)    Glucose, Bld 129 (*)    Calcium, Ion 1.12 (*)    Hemoglobin 15.6 (*)    All other components within normal limits  RESP PANEL BY RT-PCR (RSV, FLU A&B, COVID)  RVPGX2  CULTURE, BLOOD (ROUTINE X 2)  CULTURE, BLOOD (ROUTINE X 2)  MRSA NEXT GEN BY PCR, NASAL  RESPIRATORY PANEL BY PCR  ETHANOL  URINE DRUG SCREEN  CREATININE, SERUM  CBC  I-STAT CG4 LACTIC ACID, ED  I-STAT CG4 LACTIC ACID, ED  TROPONIN T, HIGH SENSITIVITY  TROPONIN T, HIGH SENSITIVITY    EKG: EKG Interpretation Date/Time:  Monday January 01 2024 03:39:44 EST Ventricular Rate:  97 PR Interval:  180 QRS Duration:  140 QT Interval:  394 QTC Calculation: 501 R Axis:   267  Text Interpretation: Sinus rhythm Nonspecific IVCD with LAD Probable anteroseptal infarct, recent Confirmed by Griselda Norris [phone]) on 01/01/2024 4:05:37  AM  Radiology: [facility] Chest Port 1 View Result Date: 01/01/2024 EXAM: 1 VIEW(S) XRAY OF THE CHEST 01/01/2024 03:58:33 AM COMPARISON: Portable chest and CTA chest both 01/14/2023. CLINICAL HISTORY: SOB SOB FINDINGS: LUNGS AND PLEURA: Increased central vascular prominence. Generalized interstitial consolidation consistent with edema. Ill-defined right upper lobe perihilar architectural distortion approaching 2.6 cm question vascular summation versus parenchymal mass. Small pleural effusions. No pneumothorax. HEART AND MEDIASTINUM: Stable mediastinum. Aortic tortuosity and atherosclerosis. BONES AND SOFT TISSUES: Right axillary surgical clips. Healed fractures of the right anterior third through fifth ribs with hypertrophic callus,  previously seen and unchanged. No new osseous findings. IMPRESSION: 1. Chf or fluid overload  with vascular congestion and interstitial edema. . 2. Small pleural effusions. 3. Progress chest films recommended depending on clinical response. 4. Right upper lobe perihilar architectural distortion/mass or vascular summation artifact. Attention on follow-up film recommended. Electronically signed by: Francis Quam MD 01/01/2024 04:13 AM EST RP Workstation: [id number]     Procedures  CRITICAL CARE Performed by: Almarie Burner   Total critical care time: 35 minutes  Critical care time was exclusive of separately billable procedures and treating other patients.  Critical care was necessary to treat or prevent imminent or life-threatening deterioration.  Critical care was time spent personally by me on the following activities: development of treatment plan with patient and/or surrogate as well as nursing, discussions with consultants, evaluation of patient's response to treatment, examination of patient, obtaining history from patient or surrogate, ordering and performing treatments and interventions, ordering and review of laboratory studies, ordering and review of radiographic  studies, pulse oximetry and re-evaluation of patient's condition.  Medications Ordered in the ED  doxycycline (VIBRAMYCIN) 100 mg in sodium chloride  0.9 % 250 mL IVPB (has no administration in time range)  Chlorhexidine Gluconate Cloth 2 % PADS 6 each (has no administration in time range)  docusate sodium (COLACE) capsule 100 mg (has no administration in time range)  polyethylene glycol (MIRALAX / GLYCOLAX) packet 17 g (has no administration in time range)  enoxaparin (LOVENOX) injection 40 mg (has no administration in time range)  ipratropium-albuterol  (DUONEB) 0.5-2.5 (3) MG/3ML nebulizer solution 3 mL (has no administration in time range)  ipratropium-albuterol  (DUONEB) 0.5-2.5 (3) MG/3ML nebulizer solution 3 mL (has no administration in time range)  LORazepam  (ATIVAN ) injection 1-2 mg (has no administration in time range)  thiamine (VITAMIN B1) 200 mg in sodium chloride  0.9 % 50 mL IVPB (has no administration in time range)    Or  thiamine (VITAMIN B1) tablet 100 mg (has no administration in time range)  folic acid (FOLVITE) tablet 1 mg (has no administration in time range)  multivitamin with minerals tablet 1 tablet (has no administration in time range)  LORazepam  (ATIVAN ) injection 2 mg (2 mg Intravenous Given 01/01/24 0422)  ipratropium-albuterol  (DUONEB) 0.5-2.5 (3) MG/3ML nebulizer solution 3 mL (3 mLs Nebulization Given 01/01/24 0418)  methylPREDNISolone sodium succinate (SOLU-MEDROL) 125 mg/2 mL injection 125 mg (125 mg Intravenous Given 01/01/24 0420)  furosemide (LASIX) injection 20 mg (20 mg Intravenous Given 01/01/24 0620)  cefTRIAXone (ROCEPHIN) 1 g in sodium chloride  0.9 % 100 mL IVPB (1 g Intravenous New Bag/Given 01/01/24 0621)                                    Medical Decision Making Amount and/or Complexity of Data Reviewed Labs: ordered. Radiology: ordered.  Risk Prescription drug management. Decision regarding hospitalization.   Patient with history of  alcohol use disorder, COPD, hypertension here for evaluation of shortness of breath in setting of trying to self detox from alcohol.  Patient in distress at time of ED assessment with slightly decreased air movement bilaterally, crackles, tachypnea, tremulousness and anxiety, mild agitation.  She was treated with lorazepam  for possible alcohol withdrawal.  She was also given Solu-Medrol for possible reactive airway disease as well as a neb.  She did not have any significant improvement in her respirations with these medications, she did have improvement in her agitation with the lorazepam .  Chest x-ray with pulmonary edema-images personally reviewed and interpreted, agree with radiologist interpretation.  She was transition to BiPAP for respiratory support.  She overall appears volume down, but given her pulmonary edema on chest x-ray we will trial with a dose of Lasix.  Labs significant for elevation in BNP, normal troponin.  EKG with new bundle branch block.  Patient does not have any active chest pain.  Given concern for new onset heart failure as well as alcohol withdrawal critical care consulted for admission for ongoing treatment.  Will also start on antibiotics for possible pneumonia pending ongoing treatment and workup.     Final diagnoses:  Acute congestive heart failure, unspecified heart failure type (HCC)  Alcohol withdrawal syndrome with complication [facility])    ED Discharge Orders     None          Griselda Norris, MD 01/01/24 [phone]

## 2024-01-01 NOTE — H&P (Signed)
 NAME:  Wanda Dorsey, MRN:  [id number], DOB:  April 01, 1945, LOS: 0 ADMISSION DATE:  01/01/2024, CONSULTATION DATE:  01/01/24 REFERRING MD:  EDP, CHIEF COMPLAINT:  sob   History of Present Illness:  78 yo female presented with reportedly chronic sob but acutely worsened from baseline 2 days ago. Pt known etoh use (1/2 bottle of vodka a day) with reported h/o withdrawal syndromes. She states that the sob correlates to her decision to abstain from etoh use; last drink was Thursday evening. She has been utilizing valium  to assist in successful wean however her sob has worsened. Denies any fever,chills, cough, recent known sick contacts. No recent lengthy travel or periods of immobility. No change in medications, nothing appears to improve her symptoms.   At time of my exam pt is resting comfortably without resp distress. She offers no complaints but drifts to sleep when not stimulated.   Pertinent  Medical History  Breast cancer Prior GI bleed Htn copd  Significant Hospital Events: Including procedures, antibiotic start and stop dates in addition to other pertinent events   Admitted to ICU 11/17  Interim History / Subjective:    Objective    Blood pressure 130/84, pulse 97, temperature (!) 97.3 F (36.3 C), resp. rate (!) 30, SpO2 97%.    FiO2 (%):  [30 %] 30 % PEEP:  [5 cmH20] 5 cmH20 Pressure Support:  [10 cmH20] 10 cmH20  No intake or output data in the 24 hours ending 01/01/24 0556 There were no vitals filed for this visit.  Examination: General: nad resting comfortably with NIV in place HENT: ncat eomi, perrla NIV in place covering mm eval Lungs: clear anteriorly but diminished in bases Cardiovascular: rrr Abdomen: soft nt/nd bs+ Extremities: no c/c/e + tattoos Neuro: no focal deficits, follows commands GU: deferred  Resolved problem list   Assessment and Plan  Acute hypoxic resp distress Pulmonary edema Acute etoh withdrawal Chf with elevated BNP  ~4K Leukocytosis Thrombocytopenia Transaminitis -Titrate NIV for resp support -oxygen sat goal >92% -prn nebs with h/o copd but less likely copd exacerbation and more likely chf with imaging and bnp -have requested edp provide diuresis at this time -echo pending in am  -order uds and etoh level -trend lft's -follow plts and wbc, although less likely infection considering lack of symptoms or fever other than sob -can send rvp for completeness sake -ciwa protocol -needs follow for I/o monitoring   Labs   CBC: Recent Labs  Lab 01/01/24 0353 01/01/24 0426  WBC 12.2*  --   HGB 14.9 15.6*  HCT 45.5 46.0  MCV 99.8  --   PLT 115*  --     Basic Metabolic Panel: Recent Labs  Lab 01/01/24 0353 01/01/24 0426  NA 143 139  K 4.0 5.3*  CL 104 107  CO2 28  --   GLUCOSE 126* 129*  BUN 20 29*  CREATININE 0.68 0.70  CALCIUM 9.5  --    GFR: CrCl cannot be calculated (Unknown ideal weight.). Recent Labs  Lab 01/01/24 0353 01/01/24 0421  WBC 12.2*  --   LATICACIDVEN  --  1.3    Liver Function Tests: Recent Labs  Lab 01/01/24 0353  AST 101*  ALT 70*  ALKPHOS 91  BILITOT 0.9  PROT 7.0  ALBUMIN 3.9   No results for input(s): LIPASE, AMYLASE in the last 168 hours. No results for input(s): AMMONIA in the last 168 hours.  ABG    Component Value Date/Time   HCO3 29.1 (H)  01/01/2024 0415   TCO2 25 01/01/2024 0426   O2SAT 40.2 01/01/2024 0415     Coagulation Profile: No results for input(s): INR, PROTIME in the last 168 hours.  Cardiac Enzymes: No results for input(s): CKTOTAL, CKMB, CKMBINDEX, TROPONINI in the last 168 hours.  HbA1C: No results found for: HGBA1C  CBG: No results for input(s): GLUCAP in the last 168 hours.  Review of Systems:   As per HPI  Past Medical History:  She,  has a past medical history of Cancer (HCC), Hearing loss, and Hypertension.   Surgical History:   Past Surgical History:  Procedure Laterality Date    BREAST SURGERY     CESAREAN SECTION     COLONOSCOPY Left 04/20/2023   Procedure: COLONOSCOPY;  Surgeon: Rollin Dover, MD;  Location: [facility];  Service: Gastroenterology;  Laterality: Left;     Social History:   reports that she has quit smoking. She has never used smokeless tobacco. She reports current alcohol use. She reports that she does not use drugs.   Family History:  Her family history is not on file.   Allergies Allergies  Allergen Reactions   Other Itching    Tree nuts- itching Hot peppers- jalapeno, etc..- get rash around mouth    Penicillins Rash and Other (See Comments)    Reaction: unknown  Has patient had a PCN reaction causing immediate rash, facial/tongue/throat swelling, SOB or lightheadedness with hypotension: unknown Has patient had a PCN reaction causing severe rash involving mucus membranes or skin necrosis: unknown Has patient had a PCN reaction that required hospitalization: no Has patient had a PCN reaction occurring within the last 10 years: no If all of the above answers are NO, then may proceed with Cephalosporin use.      Home Medications  Prior to Admission medications   Medication Sig Start Date End Date Taking? Authorizing Provider  anastrozole  (ARIMIDEX ) 1 MG tablet Take 1 mg by mouth daily.  04/10/11  Yes [provider]  BREYNA 160-4.5 MCG/ACT inhaler Inhale 2 puffs into the lungs 2 (two) times daily. 11/10/23  Yes [provider]  cholecalciferol  (VITAMIN D ) 1000 UNITS tablet Take 5,000 Units by mouth daily.   Yes [provider]  cloNIDine  (CATAPRES ) 0.1 MG tablet Take 0.1 mg by mouth every 6 (six) hours as needed.   Yes [provider]  ipratropium-albuterol  (DUONEB) 0.5-2.5 (3) MG/3ML SOLN Take 3 mLs by nebulization. Every 6-8 Hours PRN 07/30/23  Yes [provider]  lisinopril  (ZESTRIL ) 20 MG tablet Take 1 tablet (20 mg total) by mouth daily. 04/20/23  Yes Jillian Buttery, MD  LORazepam   (ATIVAN ) 0.5 MG tablet Take 0.5 mg by mouth 3 (three) times daily as needed. 07/07/23  Yes [provider]  metoprolol  (TOPROL -XL) 200 MG 24 hr tablet Take 200 mg by mouth daily. 10/03/23  Yes [provider]  PROAIR  RESPICLICK 108 (90 Base) MCG/ACT AEPB Take 2 puffs by mouth 4 (four) times daily as needed (sob/wheezing).  05/03/19  Yes [provider]  thiamine (VITAMIN B1) 100 MG tablet Take 200 mg by mouth daily.   Yes [provider]  cetirizine (ZYRTEC) 10 MG tablet Take 10 mg by mouth daily as needed for allergies. Patient not taking: Reported on 01/01/2024    [provider]  metoprolol  tartrate (LOPRESSOR ) 100 MG tablet Take 100 mg by mouth 2 (two) times daily. Patient not taking: Reported on 01/01/2024    [provider]  Multiple Vitamin (MULTIVITAMIN WITH MINERALS) TABS  tablet Take 1 tablet by mouth daily. Patient not taking: Reported on 01/01/2024    [provider]  naproxen sodium (ALEVE) 220 MG tablet Take 220 mg by mouth 2 (two) times daily as needed (pain).    [provider]     Critical care time: 

## 2024-01-01 NOTE — ED Triage Notes (Signed)
 Pt came in via EMS w/ c/o of SOB since Friday. States she has been SOB for years but has been worse since she quit drinking on Friday. Use to drink 3/4 of a bottle vodka a day. Hx of asthma and breast cancer. Denies chest pain. On 4L [place]. Not normally on O2.

## 2024-01-02 ENCOUNTER — Telehealth (HOSPITAL_COMMUNITY): Payer: Self-pay

## 2024-01-02 ENCOUNTER — Inpatient Hospital Stay (HOSPITAL_COMMUNITY)

## 2024-01-02 ENCOUNTER — Other Ambulatory Visit (HOSPITAL_COMMUNITY): Payer: Self-pay

## 2024-01-02 DIAGNOSIS — I509 Heart failure, unspecified: Secondary | ICD-10-CM | POA: Diagnosis not present

## 2024-01-02 LAB — BASIC METABOLIC PANEL WITH GFR
Anion gap: 11 (ref 5–15)
BUN: 22 mg/dL (ref 8–23)
CO2: 28 mmol/L (ref 22–32)
Calcium: 9.1 mg/dL (ref 8.9–10.3)
Chloride: 102 mmol/L (ref 98–111)
Creatinine, Ser: 0.67 mg/dL (ref 0.44–1.00)
GFR, Estimated: >60 mL/min (ref >60)
Glucose, Bld: 119 mg/dL — ABNORMAL HIGH (ref 70–99)
Potassium: 3.7 mmol/L (ref 3.5–5.1)
Sodium: 141 mmol/L (ref 135–145)

## 2024-01-02 LAB — CBC
HCT: 40.9 % (ref 36.0–46.0)
Hemoglobin: 13.4 g/dL (ref 12.0–15.0)
MCH: 32.4 pg (ref 26.0–34.0)
MCHC: 32.8 g/dL (ref 30.0–36.0)
MCV: 98.8 fL (ref 80.0–100.0)
Platelets: 115 K/uL — ABNORMAL LOW (ref 150–400)
RBC: 4.14 MIL/uL (ref 3.87–5.11)
RDW: 14 % (ref 11.5–15.5)
WBC: 10.8 K/uL — ABNORMAL HIGH (ref 4.0–10.5)
nRBC: 0 % (ref 0.0–0.2)

## 2024-01-02 LAB — PROCALCITONIN: Procalcitonin: <0.1 ng/mL

## 2024-01-02 LAB — MAGNESIUM: Magnesium: 1.9 mg/dL (ref 1.7–2.4)

## 2024-01-02 MED ORDER — ASPIRIN 81 MG PO CHEW: 81.0000 mg | CHEWABLE_TABLET | ORAL | Status: DC

## 2024-01-02 MED ORDER — FREE WATER: 250.0000 mL | Freq: Once | Status: DC

## 2024-01-02 MED ORDER — LOSARTAN POTASSIUM 25 MG PO TABS
12.5000 mg | ORAL_TABLET | Freq: Every day | ORAL | Status: DC
  Administered 2024-01-02 – 2024-01-03 (×2): 12.5 mg via ORAL
  Filled 2024-01-02: qty 0.5
  Filled 2024-01-02: qty 1

## 2024-01-02 MED ORDER — METOPROLOL SUCCINATE ER 50 MG PO TB24
50.0000 mg | ORAL_TABLET | Freq: Every day | ORAL | Status: DC
  Administered 2024-01-02 – 2024-01-03 (×2): 50 mg via ORAL
  Filled 2024-01-02: qty 2
  Filled 2024-01-02: qty 1

## 2024-01-02 NOTE — Progress Notes (Signed)
 Patient picked up by [facility] to be transferred to [facility] cath lab.

## 2024-01-02 NOTE — H&P (View-Only) (Signed)
 Received sign out from cath lab staff Beverley who spoke with Dr. Enriqueta about need to defer case due to emergencies that delayed cath scheduled today. Dr. Jordan felt stable to defer. Progressive bed on 6e requested. I spoke with patient by phone and explained need to delay procedure. She was understanding and asked excellent questions about the procedure. We revisited consent and the process for the procedure. She wanted to relay she has hx of mastectomy and is therefore concerned about radial access - documenting here so the decision about radial vs femoral access can be reviewed by cath team in AM.   Spoke with IM attending Dr. Fausto who has spoken with her [facility] IM colleagues to have [facility] team pick up in AM. Will resume diet. Pre-cath orders re-entered. Given reported hypervolemia on exam today, did not interrupt Lasix dosing. F/u labs ordered for AM.  Informed Consent   Shared Decision Making/Informed Consent The risks [stroke (1 in 1000), death (1 in 1000), kidney failure [usually temporary] (1 in 500), bleeding (1 in 200), allergic reaction [possibly serious] (1 in 200)], benefits (diagnostic support and management of coronary artery disease) and alternatives of a cardiac catheterization were discussed in detail with Wanda Dorsey and she is willing to proceed.

## 2024-01-02 NOTE — Progress Notes (Addendum)
 Progress Note   Patient: Wanda Dorsey FMW:[id number] DOB: 1945/07/19 DOA: 01/01/2024     1 DOS: the patient was seen and examined on 01/02/2024   Brief hospital course:  78 year old female with past medical history of breast cancer, prior GI bleeding, hypertension and COPD with chronic dyspnea presented with worsening shortness of breath.  Patient was admitted to ICU on 01/01/2024 with acute respiratory failure with hypoxia requiring BiPAP.  Evaluation revealed pulmonary edema and elevated BNP.  Cardiology was consulted.  TRH assumed care 11/18.  Patient being diuresed with IV Lasix. Echo showed EF of 30 to 35% with grade 2 diastolic dysfunction and global LV hypokinesis.  Patient going to the Cath Lab for ischemic evaluation today.   Assessment and Plan:  Acute respiratory failure with hypoxia  Respiratory distress -initially required BiPAP.  Now resolved and tolerating 2 L/min oxygen.  No baseline oxygen requirement. 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 Right pleural effusion  -- Cardiology following --Being diuresed with IV Lasix 20 mg 3 times daily -- On lisinopril  10 mg and Toprol -XL 100 mg (dose is reduced from home given IV diuresis) -- Strict I/O's and daily weights -- Monitor renal function electrolytes -- Monitor respiratory status, supplement O2  Repeat chest x-ray today showed increased right basilar opacity that could be atelectasis versus pneumonia in addition to the right pleural effusion.  Patient denies cough or fever chills or symptoms of pneumonia, but could have aspirated during respiratory distress. -- Started on doxycycline by Dr. Theophilus this morning -- Check Pro-Cal -- Follow pending CT chest without contrast  Abnormal  EKG -with findings of possible anteroseptal infarct -- Cardiology following -- Taking for heart cath today -- Management per cardiology, follow recommendations  Alcohol use disorder Uncomplicated alcohol withdrawal syndrome Transaminitis likely due to alcohol Right upper quadrant ultrasound on 11/16 showed findings of diffuse hepatic steatosis --Continue monitoring on CIWA with as needed benzo -- Alcohol cessation strongly recommended and discussed with patient, chronic heavy use can lead to heart failure -- Follow LFTs  COPD --stable without wheezing to suggest exacerbation -- Continue DuoNebs as needed -- Breo substituted for home Breyna     Subjective: Patient seen awake resting in bed in stepdown unit this morning.  She is awaiting to go to cardiac cath.  She did not seem clear on the reason for this evaluation.  She denies ever having any chest pain.  She reports very poor energy levels and chronic fatigue ever since having COVID before the COVID pandemic even started.  States her breathing has improved significantly since admission.  Denies other acute complaints.  Physical Exam: Vitals:   01/02/24 0900 01/02/24 1000 01/02/24 1100 01/02/24 1115  BP: 111/77 107/66 126/79   Pulse: 77 79 76   Resp: 19 19 19    Temp:    97.8 F (36.6 C)  TempSrc:    Oral  SpO2: 96% 98% 97%   Weight:      Height:       General exam: awake, alert, no acute distress HEENT: atraumatic, clear conjunctiva, anicteric sclera,moist mucus membranes, hearing grossly normal  Respiratory system: CTAB diminished right base, no wheezes or rhonchi, normal respiratory effort.  On 2 L/min and nasal cannula O2 Cardiovascular system: normal S1/S2,  RRR,  no JVD, murmurs, rubs, gallops,  no pedal edema.   Gastrointestinal system: soft, NT, ND, no HSM felt, +bowel sounds. Central nervous system: A&O x3. no gross focal neurologic deficits, normal speech Extremities: No tremors noted Skin: dry, intact, normal  temperature, normal color, No rashes seen over the last skin Psychiatry: normal mood, congruent affect, judgement and insight appear normal   Data Reviewed:  Notable labs BMP normal other than glucose 119 CBC with WBC 10.8 and platelets 115   Family Communication: None present.  Patient updated in detail  Disposition: Status is: Inpatient Remains inpatient appropriate because: Ongoing evaluation with cardiology and remains on IV diuresis.    Planned Discharge Destination: Home    Time spent: 45 minutes  Author: Burnard DELENA Cunning, DO 01/02/2024 11:48 AM  For on call review [email or web address].

## 2024-01-02 NOTE — Progress Notes (Signed)
 SLP Cancellation Note  Patient Details Name: SIGOURNEY PORTILLO MRN: [id number] DOB: 1946/01/28   Cancelled treatment:       Reason Eval/Treat Not Completed: Other (comment) (pt npo for TEE, will continue efforts)  Madelin POUR, MS [facility] SLP Acute Rehab Services Office [phone]  Nicolas Emmie Caldron 01/02/2024, 7:56 AM

## 2024-01-02 NOTE — Telephone Encounter (Signed)
 Pharmacy Patient Advocate Encounter  Insurance verification completed.    The patient is insured through [employer]. Patient has Medicare and is not eligible for a copay card, but may be able to apply for patient assistance or Medicare RX Payment Plan (Patient Must reach out to their plan, if eligible for payment plan), if available.    Ran test claim for Breo Ellipta 100-25mcg and the current 30 day co-pay is $30.   This test claim was processed through [employer]- copay amounts may vary at other pharmacies due to [employer], or as the patient moves through the different stages of their insurance plan.

## 2024-01-02 NOTE — Progress Notes (Signed)
 Patient was assigned a bed at [facility] and will not be returning to [facility]. RN will bag up patient belongings and keep them at nursing station for when her son can pick them up. RN left voicemail for her son Elsie, and patient also notified by RN at [facility].

## 2024-01-02 NOTE — Progress Notes (Addendum)
 Received sign out from cath lab staff Beverley who spoke with Dr. Enriqueta about need to defer case due to emergencies that delayed cath scheduled today. Dr. Jordan felt stable to defer. Progressive bed on 6e requested. I spoke with patient by phone and explained need to delay procedure. She was understanding and asked excellent questions about the procedure. We revisited consent and the process for the procedure. She wanted to relay she has hx of mastectomy and is therefore concerned about radial access - documenting here so the decision about radial vs femoral access can be reviewed by cath team in AM.   Spoke with IM attending Dr. Fausto who has spoken with her [facility] IM colleagues to have [facility] team pick up in AM. Will resume diet. Pre-cath orders re-entered. Given reported hypervolemia on exam today, did not interrupt Lasix dosing. F/u labs ordered for AM.  Informed Consent   Shared Decision Making/Informed Consent The risks [stroke (1 in 1000), death (1 in 1000), kidney failure [usually temporary] (1 in 500), bleeding (1 in 200), allergic reaction [possibly serious] (1 in 200)], benefits (diagnostic support and management of coronary artery disease) and alternatives of a cardiac catheterization were discussed in detail with Wanda Dorsey and she is willing to proceed.

## 2024-01-02 NOTE — Progress Notes (Signed)
  Progress Note  Patient Name: Wanda Dorsey Date of Encounter: 01/02/2024 [facility] Cardiologist: Joelle VEAR Ren Donley, MD   Interval Summary   Reports improvement in dyspnea and denies any CP.   Vital Signs Vitals:   01/02/24 0700 01/02/24 0735 01/02/24 0754 01/02/24 0800  BP: 91/62   99/60  Pulse: 76   73  Resp: 16   19  Temp:  97.7 F (36.5 C)    TempSrc:  Oral    SpO2: 98%  97% 98%  Weight:      Height:        Intake/Output Summary (Last 24 hours) at 01/02/2024 0814 Last data filed at 01/02/2024 0622 Gross per 24 hour  Intake 518.07 ml  Output 1900 ml  Net -1381.93 ml      01/02/2024    5:00 AM 01/01/2024   10:10 PM 01/01/2024    9:00 AM  Last 3 Weights  Weight (lbs) 133 lb 6.1 oz 136 lb 0.4 oz 136 lb 3.9 oz  Weight (kg) 60.5 kg 61.7 kg 61.8 kg      Telemetry/ECG  NSR - Personally Reviewed  Physical Exam  GEN: No acute distress.   Neck: No JVD Cardiac: RRR, no murmurs, rubs, or gallops.  Respiratory: Clear to auscultation bilaterally. GI: Soft, nontender, non-distended  MS: No edema  Assessment & Plan  Ms Haire is a 4 yoM with Hx of alcohol abuse, HTN, OSA who is presenting with worsening dyspnea and orthopnea c/w CHF. TTE with EF 30-35% and WMAs.   #New onset CHF (EF30-35% 11/25) #HTN #Alcohol abuse #Hx of rectal bleeding with CSY showing diverticulosis - Improvement in dyspnea. X-ray with persistent signs of volume overload, though improved from prior.  - Cont IV lasix 20 TID  - Switch lisinopril  to losartan 12.5 mg; Reduce metop XL to 50 mg to creat room for additional GDMT - Start Empagliflozin 10 and spironolactone 12.5 in AM - Send for TSH - Plan for LHC - We will continue to follow  For questions or updates, please contact [facility] Please consult [email or web address] for contact info under   Joelle VEAR Ren Donley, MD

## 2024-01-03 ENCOUNTER — Encounter (HOSPITAL_COMMUNITY)
Admission: EM | Disposition: A | Discharge status: Home Health | Payer: Self-pay | Source: Home / Self Care | Attending: Family Medicine

## 2024-01-03 ENCOUNTER — Encounter (HOSPITAL_COMMUNITY): Payer: Self-pay | Admitting: Critical Care Medicine

## 2024-01-03 ENCOUNTER — Inpatient Hospital Stay (HOSPITAL_COMMUNITY)

## 2024-01-03 DIAGNOSIS — I5020 Unspecified systolic (congestive) heart failure: Secondary | ICD-10-CM | POA: Diagnosis not present

## 2024-01-03 DIAGNOSIS — J9601 Acute respiratory failure with hypoxia: Secondary | ICD-10-CM | POA: Diagnosis not present

## 2024-01-03 HISTORY — PX: LEFT HEART CATH AND CORONARY ANGIOGRAPHY: CATH118249

## 2024-01-03 LAB — CBC
HCT: 42.2 % (ref 36.0–46.0)
Hemoglobin: 14.1 g/dL (ref 12.0–15.0)
MCH: 32.9 pg (ref 26.0–34.0)
MCHC: 33.4 g/dL (ref 30.0–36.0)
MCV: 98.4 fL (ref 80.0–100.0)
Platelets: 111 K/uL — ABNORMAL LOW (ref 150–400)
RBC: 4.29 MIL/uL (ref 3.87–5.11)
RDW: 13.6 % (ref 11.5–15.5)
WBC: 11 K/uL — ABNORMAL HIGH (ref 4.0–10.5)
nRBC: 0 % (ref 0.0–0.2)

## 2024-01-03 LAB — BASIC METABOLIC PANEL WITH GFR
Anion gap: 12 (ref 5–15)
BUN: 27 mg/dL — ABNORMAL HIGH (ref 8–23)
CO2: 27 mmol/L (ref 22–32)
Calcium: 8.4 mg/dL — ABNORMAL LOW (ref 8.9–10.3)
Chloride: 100 mmol/L (ref 98–111)
Creatinine, Ser: 0.93 mg/dL (ref 0.44–1.00)
GFR, Estimated: >60 mL/min (ref >60)
Glucose, Bld: 103 mg/dL — ABNORMAL HIGH (ref 70–99)
Potassium: 3.3 mmol/L — ABNORMAL LOW (ref 3.5–5.1)
Sodium: 139 mmol/L (ref 135–145)

## 2024-01-03 LAB — T4, FREE: Free T4: 0.91 ng/dL (ref 0.61–1.12)

## 2024-01-03 LAB — MAGNESIUM: Magnesium: 1.6 mg/dL — ABNORMAL LOW (ref 1.7–2.4)

## 2024-01-03 LAB — TSH: TSH: 4.653 u[IU]/mL — ABNORMAL HIGH (ref 0.350–4.500)

## 2024-01-03 SURGERY — LEFT HEART CATH AND CORONARY ANGIOGRAPHY
Anesthesia: LOCAL

## 2024-01-03 MED ORDER — SODIUM CHLORIDE 0.9% FLUSH: 3.0000 mL | INTRAVENOUS | Status: DC | PRN

## 2024-01-03 MED ORDER — IOHEXOL 350 MG/ML SOLN
INTRAVENOUS | Status: DC | PRN
  Administered 2024-01-03: 25 mL

## 2024-01-03 MED ORDER — HEPARIN SODIUM (PORCINE) 1000 UNIT/ML IJ SOLN
INTRAMUSCULAR | Status: AC
  Filled 2024-01-03: qty 10

## 2024-01-03 MED ORDER — EMPAGLIFLOZIN 10 MG PO TABS
10.0000 mg | ORAL_TABLET | Freq: Every day | ORAL | Status: DC
  Administered 2024-01-03 – 2024-01-05 (×3): 10 mg via ORAL
  Filled 2024-01-03 (×3): qty 1

## 2024-01-03 MED ORDER — LIDOCAINE HCL (PF) 1 % IJ SOLN
INTRAMUSCULAR | Status: DC | PRN
  Administered 2024-01-03: 2 mL

## 2024-01-03 MED ORDER — ASPIRIN 81 MG PO CHEW
81.0000 mg | CHEWABLE_TABLET | ORAL | Status: AC
  Administered 2024-01-03: 81 mg via ORAL
  Filled 2024-01-03: qty 1

## 2024-01-03 MED ORDER — LABETALOL HCL 5 MG/ML IV SOLN: 10.0000 mg | INTRAVENOUS | Status: AC | PRN

## 2024-01-03 MED ORDER — ACETAMINOPHEN 325 MG PO TABS: 650.0000 mg | ORAL_TABLET | ORAL | Status: DC | PRN

## 2024-01-03 MED ORDER — VERAPAMIL HCL 2.5 MG/ML IV SOLN
INTRAVENOUS | Status: DC | PRN
  Administered 2024-01-03: 10 mL via INTRA_ARTERIAL

## 2024-01-03 MED ORDER — HYDRALAZINE HCL 20 MG/ML IJ SOLN: 10.0000 mg | INTRAMUSCULAR | Status: AC | PRN

## 2024-01-03 MED ORDER — POTASSIUM CHLORIDE CRYS ER 20 MEQ PO TBCR
40.0000 meq | EXTENDED_RELEASE_TABLET | Freq: Once | ORAL | Status: AC
  Administered 2024-01-03: 40 meq via ORAL
  Filled 2024-01-03: qty 2

## 2024-01-03 MED ORDER — SPIRONOLACTONE 12.5 MG HALF TABLET
12.5000 mg | ORAL_TABLET | Freq: Every day | ORAL | Status: DC
  Administered 2024-01-03: 12.5 mg via ORAL
  Filled 2024-01-03: qty 1

## 2024-01-03 MED ORDER — SODIUM CHLORIDE 0.9% FLUSH
3.0000 mL | Freq: Two times a day (BID) | INTRAVENOUS | Status: DC
  Administered 2024-01-03 – 2024-01-05 (×5): 3 mL via INTRAVENOUS

## 2024-01-03 MED ORDER — HEPARIN (PORCINE) IN NACL 1000-0.9 UT/500ML-% IV SOLN
INTRAVENOUS | Status: DC | PRN
  Administered 2024-01-03 (×2): 500 mL

## 2024-01-03 MED ORDER — SODIUM CHLORIDE 0.9 % IV SOLN: 250.0000 mL | INTRAVENOUS | Status: AC | PRN

## 2024-01-03 MED ORDER — MAGNESIUM SULFATE 4 GM/100ML IV SOLN
4.0000 g | Freq: Once | INTRAVENOUS | Status: AC
  Administered 2024-01-03: 4 g via INTRAVENOUS
  Filled 2024-01-03: qty 100

## 2024-01-03 MED ORDER — LIDOCAINE HCL (PF) 1 % IJ SOLN
INTRAMUSCULAR | Status: AC
  Filled 2024-01-03: qty 30

## 2024-01-03 MED ORDER — VERAPAMIL HCL 2.5 MG/ML IV SOLN
INTRAVENOUS | Status: AC
  Filled 2024-01-03: qty 2

## 2024-01-03 MED ORDER — FREE WATER
250.0000 mL | Freq: Once | Status: AC
  Administered 2024-01-03: 250 mL via ORAL

## 2024-01-03 MED ORDER — HEPARIN SODIUM (PORCINE) 1000 UNIT/ML IJ SOLN
INTRAMUSCULAR | Status: DC | PRN
  Administered 2024-01-03: 3000 [IU] via INTRA_ARTERIAL

## 2024-01-03 MED ORDER — ONDANSETRON HCL 4 MG/2ML IJ SOLN: 4.0000 mg | Freq: Four times a day (QID) | INTRAMUSCULAR | Status: DC | PRN

## 2024-01-03 SURGICAL SUPPLY — 8 items
CATH INFINITI AMBI 5FR TG (CATHETERS) IMPLANT
DEVICE RAD TR BAND REGULAR (VASCULAR PRODUCTS) IMPLANT
GLIDESHEATH SLEND A-KIT 6F 22G (SHEATH) IMPLANT
GUIDEWIRE INQWIRE 1.5J.035X260 (WIRE) IMPLANT
KIT SYRINGE INJ CVI SPIKEX1 (MISCELLANEOUS) IMPLANT
PACK CARDIAC CATHETERIZATION (CUSTOM PROCEDURE TRAY) ×2 IMPLANT
SET ATX-X65L (MISCELLANEOUS) IMPLANT
WIRE HI TORQ VERSACORE-J 145CM (WIRE) IMPLANT

## 2024-01-03 NOTE — Progress Notes (Signed)
 Heart Failure Nurse Navigator Progress Note  PCP: Austin Mutton, MD PCP-Cardiologist: Donley ( New)  Admission Diagnosis: Acute congestive heart failure, Alcohol withdrawal syndrome with complication.  Admitted from: Home via EMS   Presentation:   Wanda Dorsey presented with shortness of breath, has been for years. On Friday she quit drinking alcohol. Usually would drink about 3/4 bottle of Vodka daily.Denies any lower extremity edema but does feel like abdomen is more distended.  BP 129/92, HR 87, Pro BNP 3,902, CXR with pulmonary edema, placed on BiPAP for respiratory support, EKG with a new BBB. 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   Patient, her husband, and son ( PRN Nurse with Behavioral Health) were educated on the sign and symptoms of heart failure, daily weights, when to call her doctor or go to the ED. Diet/ fluid restrictions, taking all medications as prescribed and attending all medical appointments. Patient verbalized her understanding of the education. A HF TOC appointment was scheduled for 01/09/2024 @ 11 am.   ECHO/ LVEF: 30-35%   Clinical Course:  Past Medical History:  Diagnosis Date   Cancer (HCC)    Hearing loss    Hypertension      Social History   Socioeconomic History   Marital status: Married    Spouse name: Not on file   Number of children: Not on file   Years of education: Not on file   Highest education level: Not on file  Occupational History   Not on file  Tobacco Use   Smoking status: Former   Smokeless tobacco: Never  Substance and Sexual Activity   Alcohol use: Yes    Comment: 80 proff vodka approx 20 oz daily    Drug use: No   Sexual activity: Not on file  Other Topics Concern   Not on file  Social History Narrative   Not on file   Social Drivers of Health   Financial Resource Strain: Not on file  Food Insecurity: No Food Insecurity (01/01/2024)   Hunger Vital Sign     Worried About Running Out of Food in the Last Year: Never true    Ran Out of Food in the Last Year: Never true  Transportation Needs: No Transportation Needs (01/01/2024)   PRAPARE - Administrator, Civil Service (Medical): No    Lack of Transportation (Non-Medical): No  Physical Activity: Not on file  Stress: Not on file  Social Connections: Socially Integrated (01/01/2024)   Social Connection and Isolation Panel    Frequency of Communication with Friends and Family: More than three times a week    Frequency of Social Gatherings with Friends and Family: Three times a week    Attends Religious Services: More than 4 times per year    Active Member of Clubs or Organizations: No    Attends Engineer, Structural: More than 4 times per year    Marital Status: Married   Water Engineer and Provision:  Detailed education and instructions provided on heart failure disease management including the following:  Signs and symptoms of Heart Failure When to call the physician Importance of daily weights Low sodium diet Fluid restriction Medication management Anticipated future follow-up appointments  Patient education given on each of the above topics.  Patient acknowledges understanding via teach back method and acceptance of all instructions.  Education Materials:  Living Better With Heart Failure Booklet, HF zone tool, & Daily Weight Tracker Tool.  Patient has scale at  home: Yes Patient has pill box at home: NA    High Risk Criteria for Readmission and/or Poor Patient Outcomes: Heart failure hospital admissions (last 6 months): 1  No Show rate: 0 Difficult social situation: Lives with her husband and son.  Demonstrates medication adherence: Yes Primary Language: English Literacy level: Reading, writing, and comprehension.   Barriers of Care:   New HF Diet/ fluid restrictions ( Heavy ETOH daily )  Daily weights Alcohol cessation    Considerations/Referrals:   Referral made to Heart Failure Pharmacist Stewardship: Yes Referral made to Heart Failure CSW/NCM TOC: NA Referral made to Heart & Vascular TOC clinic: Yes, 01/09/2024 @ 11 am.   Items for Follow-up on DC/TOC: Continued HF education Diet/ fluid restrictions/ daily weights  Alcohol cessation    Stephane Haddock, BSN, RN Heart Failure Teacher, Adult Education Only

## 2024-01-03 NOTE — Discharge Instructions (Signed)
Outpatient Substance Use Treatment Services   [facility] Intensive Outpatient Program [address] [place], [place] [address]  [phone] Private insurance, Medicare A&B, and GCCN   [facility] ([facility])  [address]  [place], [place] [address] [phone] Medicaid, Self Pay   [facility]      [address]  [place], [place] [phone] Medicaid and Private Insurance, Self Pay   [facility] [address]  [place], [place]  [phone] Private Insurance, and Self Pay  [facility]      [address]    [place], [place] [address]  [phone] or [phone] Private Insurance Only                 [facility] [address]  [place], [place] [address] [phone] Medicaid, Medicare, Private Insurance  [facility] [address]  [place], [place] [address] [phone] Services are free or reduced  [facility]  [address] [phone]  Self Pay only, sliding scale  [facility]  [address]  [place], [place] [address] [phone] ([facility]) Self Pay, Medicaid Only   [facility] [address]  [place], [place] [address] [phone] Medicaid, Medicare, Private Insurance                     Adolescent Substance Use Treatment Services    [facility] [address]  [place], [place]  [phone] Self Pay Offer scholarships from the [employer] to help pay for treatment  Website: www.theinsightprogram.com  [facility] use [facility] Males ages: 12-17 Adolescent Substance use Program Females: 12-17  [place] Office [address]  [place], [place] [address] (ph) [phone]  (fax) [phone]  [place]  Office  [address]  [place], [place] [address] (ph) [phone]  (fax) [phone]  [place] Office [address]  [place], [place] [address] (ph) [phone]  (fax) [phone]  [place] Office [address], [place], [place] [address] (ph) [phone]   (fax) [phone]  Website: https://youthhavenservices.com/          [facility] Outpatient Substance Abuse Intensive Outpatient Program for Adolescents Phone: [phone] Address: [address], [place], [place] Website: [email or web address]/[email or web address]-health/outpatient-[email or web address]-health-care/    Residential Substance Use Treatment Services   [facility]  [address]  [place], [place] [address]  [phone] or [phone] Detox (Medicare, Medicaid, private insurance, and self pay)  Residential Rehab 14 days (Medicare, Medicaid, private insurance, and self pay)   [facility])  [address] [place], [place]  [phone]  Female and Female Detox (Self Pay and Medicaid limited availability)  Rehab only Female (Medicaid and self pay only)   [facility]      [address]  , [place] [address]  [phone] or [phone] Detox and Residential Treatment Private Insurance Only   [facility]  [address]  [place], [place] [address]  [phone]  Treatment Only, must make assessment appointment, and must be sober for assessment appointment.  Self Pay Only, Medicare A&B, [place] Medicaid, [place] ID only! *Transportation assistance offered from [employer] [address]  [facility]     [address] [place], [place] [address] Walk in interviews M-Sat 8-4p No   pending legal charges [phone]     [facility]:  [facility] Referral  [address] [place], [place] [phone] (Self Pay, Medicaid)  [facility] [address] [place], [place] [address] [phone] Detox and Residential Treatment Medicare and Private Insurance  [facility] [address]  [place], [place] [address] 28 Day Women's Facility: [phone] 28 Day Men's Facility: [phone] Long-term Residential Program:  [phone] Males 25 and Over (No Insurance, upfront fee)  [facility]  [address] [place], [place] [address] [phone] Private Insurance with [employer], Private Pay  [facility] [address] [place], [place] [address] Local [phone] Private Insurance Only  [facility] [address]  [place], [place] [address]  [phone] (Males, upfront fee)  [facility] [place] [address]  [place], [address] [phone] Private Insurance      [facility] Locations  [facility]  [address]  [place], [place]  [phone] Christian Based Program for individuals experiencing homelessness Self Pay, No insurance  [facility]  Men's program: [facility] [address]  [place], [place] [address] [phone]  [facility] Women's program: [facility] [address] [place], [place] [address] [phone] Christian Based Program for individuals experiencing homelessness Self Pay, No insurance  [facility] [address]  [place], [place] [address]  [phone] Christian Based Program for individuals experiencing homelessness Self Pay, No insurance  [facility] [address]  [place], [place] [address] [phone] Christian Based Program for individuals experiencing homelessness Self Pay, No insurance  [facility] [address] [place],  [phone] Christian Based Program for males experiencing homelessness Self Pay, No insurance 

## 2024-01-03 NOTE — TOC Initial Note (Signed)
 Transition of Care [facility]) - Initial/Assessment Note    Patient Details  Name: Wanda Dorsey MRN: [id number] Date of Birth: 1945/05/29  Transition of [facility]) CM/SW Contact:    Sudie Erminio Deems, RN Phone Number: 01/03/2024, 3:24 PM  Clinical Narrative: Patient presented for evaluation of heart failure. PTA patient states she is from home with family support son and spouse. Patient states she has DME rolling walker. Patient states she is weak; however, when PT was discussed felt she would not benefit. Patient will need a PT consult for recommendations for discharge. Patient has DME wheelchair in the home. ICM will continue to follow for additional needs as the patient progresses.                Expected Discharge Plan: Home w Home Health Services Barriers to Discharge: Continued Medical Work up   Patient Goals and CMS Choice Patient states their goals for this hospitalization and ongoing recovery are:: Plan to return home once stable.          Expected Discharge Plan and Services   Discharge Planning Services: CM Consult Post Acute Care Choice: NA Living arrangements for the past 2 months: Single Family Home                                      Prior Living Arrangements/Services Living arrangements for the past 2 months: Single Family Home Lives with:: Adult Children, Spouse Patient language and need for interpreter reviewed:: Yes Do you feel safe going back to the place where you live?: Yes      Need for Family Participation in Patient Care: Yes (Comment) Care giver support system in place?: Yes (comment) Current home services: DME (wheel chair,) Criminal Activity/Legal Involvement Pertinent to Current Situation/Hospitalization: No - Comment as needed  Activities of Daily Living   ADL Screening (condition at time of admission) Independently performs ADLs?: Yes (appropriate for developmental age) Is the patient deaf or have difficulty hearing?:  No Does the patient have difficulty seeing, even when wearing glasses/contacts?: No Does the patient have difficulty concentrating, remembering, or making decisions?: No  Permission Sought/Granted Permission sought to share information with : Family Supports, Case Manager                Emotional Assessment Appearance:: Appears stated age Attitude/Demeanor/Rapport: Unable to Assess Affect (typically observed): Unable to Assess Orientation: : Oriented to Self, Oriented to Place Alcohol / Substance Use: Not Applicable Psych Involvement: No (comment)  Admission diagnosis:  Alcohol withdrawal syndrome with complication (HCC) [F10.939] Acute hypoxemic respiratory failure (HCC) [J96.01] Acute congestive heart failure, unspecified heart failure type [facility]) [I50.9] Patient Active Problem List   Diagnosis Date Noted   Acute hypoxemic respiratory failure (HCC) 01/01/2024   Acute lower GI bleeding 04/19/2023   Syncope 04/19/2023   Essential hypertension 04/19/2023   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 [place] [address] Phone: [phone] Fax: [phone]  [facility] [address] [place] [address] Phone: [phone] Fax: [phone]     Social Drivers of Health (SDOH) Social History: SDOH Screenings   Food Insecurity: No Food Insecurity (01/01/2024)  Housing: Low Risk  (01/01/2024)  Transportation Needs: No Transportation Needs (01/01/2024)  Utilities:  Not At Risk (01/01/2024)  Alcohol Screen: Low Risk  (01/03/2024)  Financial Resource Strain: Low Risk  (01/03/2024)  Social Connections: Socially Integrated (01/01/2024)  Tobacco Use: Medium Risk (01/01/2024)   SDOH Interventions: Alcohol Usage Interventions: Intervention Not Indicated (Score  <7) Financial Strain Interventions: Intervention Not Indicated   Readmission Risk Interventions     No data to display

## 2024-01-03 NOTE — Progress Notes (Signed)
  Progress Note  Patient Name: Wanda Dorsey Date of Encounter: 01/03/2024 [facility] Cardiologist: Joelle VEAR Ren Donley, MD   Interval Summary   No events overnight. She reports improvement in her dyspnea, though not quite back to baseline.  Vital Signs Vitals:   01/02/24 2346 01/02/24 2348 01/03/24 0811 01/03/24 0829  BP: 121/74 121/74    Pulse: 76 75 80   Resp: 19 20 15    Temp: 97.7 F (36.5 C) 97.8 F (36.6 C)    TempSrc: Oral Oral    SpO2: 96% 97% 97% 95%  Weight:      Height:        Intake/Output Summary (Last 24 hours) at 01/03/2024 0843 Last data filed at 01/02/2024 2100 Gross per 24 hour  Intake 302 ml  Output 1675 ml  Net -1373 ml      01/02/2024    5:00 AM 01/01/2024   10:10 PM 01/01/2024    9:00 AM  Last 3 Weights  Weight (lbs) 133 lb 6.1 oz 136 lb 0.4 oz 136 lb 3.9 oz  Weight (kg) 60.5 kg 61.7 kg 61.8 kg      Telemetry/ECG  NSR - Personally Reviewed  Physical Exam  GEN: No acute distress.   Neck: No JVD Cardiac: RRR, no murmurs, rubs, or gallops.  Respiratory: Clear to auscultation bilaterally. GI: Soft, nontender, non-distended  MS: No edema  Assessment & Plan  Ms Seif is a 75 yoM with Hx of alcohol abuse, HTN, OSA who is presenting with worsening dyspnea and orthopnea c/w CHF. TTE with EF 30-35% and WMAs.   #New onset CHF (EF30-35% 11/25) #HTN #Alcohol abuse #Hx of rectal bleeding with CSY showing diverticulosis - Cont to have improvement in her dyspnea and appearing more euvolemic. Cont IV lasix  20 TID w/ plan to transition to PO in AM  - Follow up with LHC - Cont losartan  12.5 and metop XL 50 - Start Empagliflozin  10 and spironolactone  12.5  - We will continue to follow  For questions or updates, please contact [facility] Please consult [email or web address] for contact info under   Joelle VEAR Ren Donley, MD

## 2024-01-03 NOTE — Interval H&P Note (Signed)
 History and Physical Interval Note:  01/03/2024 8:37 AM  Wanda Dorsey  has presented today for surgery, with the diagnosis of abnomral echo.  The various methods of treatment have been discussed with the patient and family. After consideration of risks, benefits and other options for treatment, the patient has consented to  Procedure(s): LEFT HEART CATH AND CORONARY ANGIOGRAPHY (N/A) as a surgical intervention.  The patient's history has been reviewed, patient examined, no change in status, stable for surgery.  I have reviewed the patient's chart and labs.  Questions were answered to the patient's satisfaction.     Louise Victory J Kenyen Candy

## 2024-01-03 NOTE — Progress Notes (Addendum)
 PROGRESS NOTE    Wanda Dorsey  FMW:[id number] DOB: 03-30-1945 DOA: 01/01/2024 PCP: Austin Mutton, MD   Brief Narrative:  78 year old female with past medical history of breast cancer, prior GI bleeding, hypertension and COPD with chronic dyspnea presented with worsening shortness of breath.  Patient was admitted to ICU on 01/01/2024 with acute respiratory failure with hypoxia requiring BiPAP.  Evaluation revealed pulmonary edema and elevated BNP.  Cardiology was consulted.   [facility] assumed care 11/18.  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  Echo shows EF of 30 to 35% and grade 2 diastolic dysfunction.  Cardiology consulted.  Patient has been getting IV Lasix  for diuresis.  Transferred from [facility] to [facility] for cardiac cath which is planned today.  Management per cardiology.  Wean oxygen as able to.  Possible aspiration pneumonia: Repeat chest x-ray 01/02/2024 shows increased right basilar opacity which could be pneumonia or atelectasis.  Previous physicians suspected possible aspiration, started on doxycycline .  However to cover anaerobes, I will switch her to oral Augmentin p.o. postprocedure today as she is n.p.o. currently.  Hypokalemia: Replenished.  Checking magnesium . Addendum: Magnesium  low.  Will replenish.  Hypertension: Controlled, continue Toprol -XL and losartan .  Elevated TSH: Checking T3 and T4.  DVT prophylaxis: enoxaparin  (LOVENOX ) injection 40 mg Start: 01/01/24 2200 SCDs Start: 01/01/24 9348   Code Status: Full Code  Family Communication:  None present at bedside.  Plan of care discussed with patient in length and he/she verbalized understanding and agreed with it.  Status  is: Inpatient Remains inpatient appropriate because: Cath today, will be discharged when cleared by cardiology   Estimated body mass index is 24.4 kg/m as calculated from the following:   Height as of this encounter: 5' 2 (1.575 m).   Weight as of this encounter: 60.5 kg.    Nutritional Assessment: Body mass index is 24.4 kg/m.SABRA Seen by dietician.  I agree with the assessment and plan as outlined below: Nutrition Status:        . Skin Assessment: I have examined the patient's skin and I agree with the wound assessment as performed by the wound care RN as outlined below:    Consultants:  Cardiology  Procedures:  As above  Antimicrobials:  Anti-infectives (From admission, onward)    Start     Dose/Rate Route Frequency Ordered Stop   01/01/24 2200  doxycycline  (VIBRAMYCIN ) 100 mg in sodium chloride  0.9 % 250 mL IVPB        100 mg 125 mL/hr over 120 Minutes Intravenous Every 12 hours 01/01/24 1713     01/01/24 0545  cefTRIAXone  (ROCEPHIN ) 1 g in sodium chloride  0.9 % 100 mL IVPB        1 g 200 mL/hr over 30 Minutes Intravenous  Once 01/01/24 0530 01/01/24 0754   01/01/24 0545  doxycycline  (VIBRAMYCIN ) 100 mg in sodium chloride  0.9 % 250 mL IVPB        100 mg 125 mL/hr over 120 Minutes Intravenous  Once 01/01/24 0530 01/01/24 1300         Subjective: Seen and examined, she has no complaints at all.  Objective: Vitals:   01/02/24 1425 01/02/24 2000 01/02/24 2346 01/02/24 2348  BP:  126/83 121/74 121/74  Pulse: 86 78  76 75  Resp: 15 20 19 20   Temp:  97.8 F (36.6 C) 97.7 F (36.5 C) 97.8 F (36.6 C)  TempSrc:  Oral Oral Oral  SpO2: 96% 96% 96% 97%  Weight:      Height:        Intake/Output Summary (Last 24 hours) at 01/03/2024 0811 Last data filed at 01/02/2024 2100 Gross per 24 hour  Intake 302 ml  Output 1675 ml  Net -1373 ml   Filed Weights   01/01/24 0900 01/01/24 2210 01/02/24 0500  Weight: 61.8 kg 61.7 kg 60.5 kg     Examination:  General exam: Appears calm and comfortable  Respiratory system: Clear to auscultation. Respiratory effort normal. Cardiovascular system: S1 & S2 heard, RRR. No JVD, murmurs, rubs, gallops or clicks. No pedal edema. Gastrointestinal system: Abdomen is nondistended, soft and nontender. No organomegaly or masses felt. Normal bowel sounds heard. Central nervous system: Alert and oriented. No focal neurological deficits. Extremities: Symmetric 5 x 5 power. Skin: No rashes, lesions or ulcers Psychiatry: Judgement and insight appear normal. Mood & affect appropriate.    Data Reviewed: I have personally reviewed following labs and imaging studies  CBC: Recent Labs  Lab 01/01/24 0353 01/01/24 0426 01/01/24 0958 01/02/24 0309 01/03/24 0423  WBC 12.2*  --  9.2 10.8* 11.0*  HGB 14.9 15.6* 14.1 13.4 14.1  HCT 45.5 46.0 43.3 40.9 42.2  MCV 99.8  --  99.1 98.8 98.4  PLT 115*  --  112* 115* 111*   Basic Metabolic Panel: Recent Labs  Lab 01/01/24 0353 01/01/24 0426 01/01/24 0603 01/02/24 0309 01/03/24 0423  NA 143 139  --  141 139  K 4.0 5.3*  --  3.7 3.3*  CL 104 107  --  102 100  CO2 28  --   --  28 27  GLUCOSE 126* 129*  --  119* 103*  BUN 20 29*  --  22 27*  CREATININE 0.68 0.70 0.55 0.67 0.93  CALCIUM 9.5  --   --  9.1 8.4*  MG  --   --   --  1.9  --    GFR: Estimated Creatinine Clearance: 42 mL/min (by C-G formula based on SCr of 0.93 mg/dL). Liver Function Tests: Recent Labs  Lab 01/01/24 0353  AST 101*  ALT 70*  ALKPHOS 91  BILITOT 0.9  PROT 7.0  ALBUMIN 3.9   No results for input(s): LIPASE, AMYLASE in the last 168 hours. No results for input(s): AMMONIA in the last 168 hours. Coagulation Profile: No results for input(s): INR, PROTIME in the last 168 hours. Cardiac Enzymes: No results for input(s): CKTOTAL, CKMB, CKMBINDEX, TROPONINI in the last 168 hours. BNP (last 3 results) Recent Labs    01/01/24 0403  PROBNP  3,902.0*   HbA1C: No results for input(s): HGBA1C in the last 72 hours. CBG: No results for input(s): GLUCAP in the last 168 hours. Lipid Profile: Recent Labs    01/01/24 0958  CHOL 178  HDL 92  LDLCALC 75  TRIG 53  CHOLHDL 1.9   Thyroid Function Tests: Recent Labs    01/03/24 0423  TSH 4.653*   Anemia Panel: No results for input(s): VITAMINB12, FOLATE, FERRITIN, TIBC, IRON, RETICCTPCT in the last 72 hours. Sepsis Labs: Recent Labs  Lab 01/01/24 0421 01/02/24 0309  PROCALCITON  --  <0.10  LATICACIDVEN 1.3  --     Recent Results (from the past 240 hours)  Resp panel by RT-PCR (RSV, Flu A&B, Covid) Anterior  Nasal Swab     Status: None   Collection Time: 01/01/24  3:53 AM   Specimen: Anterior Nasal Swab  Result Value Ref Range Status   SARS Coronavirus 2 by RT PCR NEGATIVE NEGATIVE Final    Comment: (NOTE) SARS-CoV-2 target nucleic acids are NOT DETECTED.  The SARS-CoV-2 RNA is generally detectable in upper respiratory specimens during the acute phase of infection. The lowest concentration of SARS-CoV-2 viral copies this assay can detect is 138 copies/mL. A negative result does not preclude SARS-Cov-2 infection and should not be used as the sole basis for treatment or other patient management decisions. A negative result may occur with  improper specimen collection/handling, submission of specimen other than nasopharyngeal swab, presence of viral mutation(s) within the areas targeted by this assay, and inadequate number of viral copies(<138 copies/mL). A negative result must be combined with clinical observations, patient history, and epidemiological information. The expected result is Negative.  Fact Sheet for Patients:  bloggercourse.com  Fact Sheet for Healthcare Providers:  seriousbroker.it  This test is no t yet approved or cleared by the [place]  FDA and  has been authorized for  detection and/or diagnosis of SARS-CoV-2 by FDA under an Emergency Use Authorization (EUA). This EUA will remain  in effect (meaning this test can be used) for the duration of the COVID-19 declaration under Section 564(b)(1) of the Act, 21 U.S.C.section 360bbb-3(b)(1), unless the authorization is terminated  or revoked sooner.       Influenza A by PCR NEGATIVE NEGATIVE Final   Influenza B by PCR NEGATIVE NEGATIVE Final    Comment: (NOTE) The Xpert Xpress SARS-CoV-2/FLU/RSV plus assay is intended as an aid in the diagnosis of influenza from Nasopharyngeal swab specimens and should not be used as a sole basis for treatment. Nasal washings and aspirates are unacceptable for Xpert Xpress SARS-CoV-2/FLU/RSV testing.  Fact Sheet for Patients: bloggercourse.com  Fact Sheet for Healthcare Providers: seriousbroker.it  This test is not yet approved or cleared by the [place]  FDA and has been authorized for detection and/or diagnosis of SARS-CoV-2 by FDA under an Emergency Use Authorization (EUA). This EUA will remain in effect (meaning this test can be used) for the duration of the COVID-19 declaration under Section 564(b)(1) of the Act, 21 U.S.C. section 360bbb-3(b)(1), unless the authorization is terminated or revoked.     Resp Syncytial Virus by PCR NEGATIVE NEGATIVE Final    Comment: (NOTE) Fact Sheet for Patients: bloggercourse.com  Fact Sheet for Healthcare Providers: seriousbroker.it  This test is not yet approved or cleared by the [place]  FDA and has been authorized for detection and/or diagnosis of SARS-CoV-2 by FDA under an Emergency Use Authorization (EUA). This EUA will remain in effect (meaning this test can be used) for the duration of the COVID-19 declaration under Section 564(b)(1) of the Act, 21 U.S.C. section 360bbb-3(b)(1), unless the authorization is  terminated or revoked.  Performed at [facility], [address] [place], [place] [address]   Respiratory (~20 pathogens) panel by PCR     Status: None   Collection Time: 01/01/24  3:53 AM   Specimen: Nasopharyngeal Swab; Respiratory  Result Value Ref Range Status   Adenovirus NOT DETECTED NOT DETECTED Final   Coronavirus 229E NOT DETECTED NOT DETECTED Final    Comment: (NOTE) The Coronavirus on the Respiratory Panel, DOES NOT test for the novel  Coronavirus (2019 nCoV)    Coronavirus HKU1 NOT DETECTED NOT DETECTED Final   Coronavirus NL63 NOT DETECTED NOT DETECTED  Final   Coronavirus OC43 NOT DETECTED NOT DETECTED Final   Metapneumovirus NOT DETECTED NOT DETECTED Final   Rhinovirus / Enterovirus NOT DETECTED NOT DETECTED Final   Influenza A NOT DETECTED NOT DETECTED Final   Influenza B NOT DETECTED NOT DETECTED Final   Parainfluenza Virus 1 NOT DETECTED NOT DETECTED Final   Parainfluenza Virus 2 NOT DETECTED NOT DETECTED Final   Parainfluenza Virus 3 NOT DETECTED NOT DETECTED Final   Parainfluenza Virus 4 NOT DETECTED NOT DETECTED Final   Respiratory Syncytial Virus NOT DETECTED NOT DETECTED Final   Bordetella pertussis NOT DETECTED NOT DETECTED Final   Bordetella Parapertussis NOT DETECTED NOT DETECTED Final   Chlamydophila pneumoniae NOT DETECTED NOT DETECTED Final   Mycoplasma pneumoniae NOT DETECTED NOT DETECTED Final    Comment: Performed at [facility], [address], [place], [place] [address]  Culture, blood (routine x 2)     Status: None (Preliminary result)   Collection Time: 01/01/24  5:50 AM   Specimen: BLOOD LEFT FOREARM  Result Value Ref Range Status   Specimen Description   Final    BLOOD LEFT FOREARM Performed at [facility], [address], [place], [place] [address]    Special Requests   Final    BOTTLES DRAWN AEROBIC AND ANAEROBIC Blood Culture adequate volume Performed at [facility], [address], [place], [place] [address]    Culture   Final    NO GROWTH 2 DAYS Performed at [facility], [address] [place], [place] [address]    Report Status PENDING  Incomplete  Culture, blood (routine x 2)     Status: None (Preliminary result)   Collection Time: 01/01/24  5:54 AM   Specimen: BLOOD LEFT HAND  Result Value Ref Range Status   Specimen Description   Final    BLOOD LEFT HAND Performed at [facility], [address], [place], [place] [address]    Special Requests   Final    BOTTLES DRAWN AEROBIC AND ANAEROBIC Blood Culture adequate volume Performed at [facility], [address], [place], [place] [address]    Culture   Final    NO GROWTH 2 DAYS Performed at [facility], [address], [place], [place] [address]    Report Status PENDING  Incomplete  MRSA Next Gen by PCR, Nasal     Status: None   Collection Time: 01/01/24  8:56 AM   Specimen: Nasal Swab  Result Value Ref Range Status   MRSA by PCR Next Gen NOT DETECTED NOT DETECTED Final    Comment: (NOTE) The GeneXpert MRSA Assay (FDA approved for NASAL specimens only), is one component of a comprehensive MRSA colonization surveillance program. It is not intended to diagnose MRSA infection nor to guide or monitor treatment for MRSA infections. Test performance is not FDA approved in patients less than 58 years old. Performed at [facility], [address] [place], [place] [address]      Radiology Studies: CT CHEST WO CONTRAST Result Date: 01/03/2024 EXAM: CT CHEST WITHOUT CONTRAST 01/14/2023 12:05:38 AM TECHNIQUE: CT of the chest was performed without the administration of intravenous contrast. Multiplanar reformatted images are provided for review. Automated exposure control, iterative reconstruction, and/or weight based adjustment of the mA/kV was utilized to reduce the radiation dose to as low as reasonably achievable. COMPARISON: None  available. CLINICAL HISTORY: Abnormal xray - lung opacity/opacities FINDINGS: MEDIASTINUM: Heart and  pericardium are unremarkable. At least 3-vessel coronary artery calcifications. The thoracic aorta and pulmonary artery are normal in caliber. At least moderate thoracic aorta atherosclerotic plaque. The central airways are clear. LYMPH NODES: Status post right axillary lymph node dissection. No gross hilar adenopathy with limited evaluation on this noncontrast study. No mediastinal lymphadenopathy. LUNGS AND PLEURA: Bilateral lower lobe opacities. Right upper lobe linear scarring. Small right and trace left pleural effusions. No pneumothorax. SOFT TISSUES/BONES: Slight interval increase in size of right anterolateral 3-5 expansible sclerotic ossification lesion (7:84). 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 UPPER ABDOMEN: Limited images of the upper abdomen demonstrates no acute abnormality. IMPRESSION: 1. Small right and trace left pleural effusions. 2. Bilateral lower lobe opacities, possibly atelectasis or infection, with right upper lobe linear scarring noted. 3. Slight interval increase in size of the right anterolateral rib 35 expansile sclerotic lesion. Consider MRI with and without contrast for further evaluation if concerned for chondrosarcoma. 4. At least moderate thoracic aortic atherosclerotic plaque and multivessel coronary artery calcifications. Electronically signed by: Morgane Naveau MD 01/03/2024 12:43 AM EST RP Workstation: [id number]   DG Chest Port 1 View Result Date: 01/02/2024 CLINICAL DATA:  Hypoxia EXAM: PORTABLE CHEST 1 VIEW COMPARISON:  01/01/2024 FINDINGS: Low lung volumes are present, causing crowding of the pulmonary vasculature. Increased airspace opacity peripherally at the right lung base. Older low ultrasound revealed a right pleural effusion and this may represent atelectasis or pneumonia in conjunction with the pleural effusion. Irregular calcified lesion of the right anterior  third rib as on prior chest CT of 01/14/2023, unchanged Suspected small left pleural effusion. Mildly improved retrocardiac aeration compared to yesterday. Mild enlargement of the cardiopericardial silhouette. Right axillary clips and right mastectomy. IMPRESSION: 1. Increased airspace opacity peripherally at the right lung base, potentially from atelectasis or pneumonia in conjunction with the pleural effusion. 2. Suspected small left pleural effusion. 3. Mild enlargement of the cardiopericardial silhouette. 4. Irregular calcified lesion of the right anterior third rib as on prior chest CT of 01/14/2023, unchanged. Electronically Signed   By: Ryan Salvage M.D.   On: 01/02/2024 09:40   US  Abdomen Limited RUQ (LIVER/GB) Result Date: 01/02/2024 CLINICAL DATA:  Transaminitis EXAM: ULTRASOUND ABDOMEN LIMITED RIGHT UPPER QUADRANT COMPARISON:  Overlapping portions CT chest 01/14/2023 FINDINGS: Gallbladder: No gallstones or wall thickening visualized. No sonographic Murphy sign noted by sonographer. Common bile duct: Diameter: 0.5 cm, within normal limits for age Liver: No focal lesion identified. Coarse echogenic liver with poor sonic penetration compatible with diffuse hepatic steatosis. Portal vein is patent on color Doppler imaging with normal direction of blood flow towards the liver. Other: Right pleural effusion. Limitations: ICU patient, no left lateral decubitus imaging available IMPRESSION: 1. Coarse echogenic liver with poor sonic penetration compatible with diffuse hepatic steatosis. 2. Right pleural effusion. Electronically Signed   By: Ryan Salvage M.D.   On: 01/02/2024 09:28   ECHOCARDIOGRAM COMPLETE Result Date: 01/01/2024    ECHOCARDIOGRAM REPORT   Patient Name:   Wanda Dorsey Date of Exam: 01/01/2024 Medical Rec #:  [id number]      Height:       62.0 in Accession #:    [id number]     Weight:       136.2 lb Date of Birth:  02/08/1946      BSA:          1.624 m Patient Age:    78 years        BP:  105/70 mmHg Patient Gender: F              HR:           54 bpm. Exam Location:  Inpatient Procedure: 2D Echo, Cardiac Doppler, Color Doppler and Intracardiac            Opacification Agent (Both Spectral and Color Flow Doppler were            utilized during procedure). Indications:    R94.31 Abnormal EKG; I50.40* Unspecified combined systolic                 (congestive) and diastolic (congestive) heart failure  History:        Patient has no prior history of Echocardiogram examinations.                 COPD, Signs/Symptoms:Shortness of Breath, Dyspnea and Syncope;                 Risk Factors:Sleep Apnea and Hypertension. ETOH withdrawals.                 Breast cancer.  Sonographer:    Ellouise Mose RDCS Referring Phys: [id number] JESSICA MARSHALL IMPRESSIONS  1. Left ventricular ejection fraction, by estimation, is 30 to 35%. The left ventricle has moderately decreased function. The left ventricle demonstrates global hypokinesis. Left ventricular diastolic parameters are consistent with Grade II diastolic dysfunction (pseudonormalization).  2. Right ventricular systolic function is normal. The right ventricular size is normal. There is normal pulmonary artery systolic pressure. The estimated right ventricular systolic pressure is 33.4 mmHg.  3. Moderate pleural effusion.  4. The mitral valve is normal in structure. Trivial mitral valve regurgitation. No evidence of mitral stenosis.  5. The aortic valve is normal in structure. Aortic valve regurgitation is not visualized. No aortic stenosis is present.  6. The inferior vena cava is normal in size with greater than 50% respiratory variability, suggesting right atrial pressure of 3 mmHg. Comparison(s): No prior Echocardiogram. FINDINGS  Left Ventricle: Left ventricular ejection fraction, by estimation, is 30 to 35%. The left ventricle has moderately decreased function. The left ventricle demonstrates global hypokinesis. Definity contrast agent was  given IV to delineate the left ventricular endocardial borders. The left ventricular internal cavity size was normal in size. There is no left ventricular hypertrophy. Left ventricular diastolic parameters are consistent with Grade II diastolic dysfunction (pseudonormalization). Right Ventricle: The right ventricular size is normal. No increase in right ventricular wall thickness. Right ventricular systolic function is normal. There is normal pulmonary artery systolic pressure. The tricuspid regurgitant velocity is 2.52 m/s, and  with an assumed right atrial pressure of 8 mmHg, the estimated right ventricular systolic pressure is 33.4 mmHg. Left Atrium: Left atrial size was normal in size. Right Atrium: Right atrial size was normal in size. Pericardium: There is no evidence of pericardial effusion. Mitral Valve: The mitral valve is normal in structure. Trivial mitral valve regurgitation. No evidence of mitral valve stenosis. Tricuspid Valve: The tricuspid valve is normal in structure. Tricuspid valve regurgitation is mild . No evidence of tricuspid stenosis. Aortic Valve: The aortic valve is normal in structure. Aortic valve regurgitation is not visualized. No aortic stenosis is present. Pulmonic Valve: The pulmonic valve was normal in structure. Pulmonic valve regurgitation is not visualized. No evidence of pulmonic stenosis. Aorta: The aortic root is normal in size and structure. Venous: The inferior vena cava is normal in size with greater than 50% respiratory variability, suggesting right atrial  pressure of 3 mmHg. IAS/Shunts: No atrial level shunt detected by color flow Doppler. Additional Comments: There is a moderate pleural effusion.  LEFT VENTRICLE PLAX 2D LVIDd:         4.60 cm      Diastology LVIDs:         3.80 cm      LV e' medial:    2.83 cm/s LV PW:         1.10 cm      LV E/e' medial:  19.6 LV IVS:        0.95 cm      LV e' lateral:   4.46 cm/s LVOT diam:     2.20 cm      LV E/e' lateral: 12.4 LV SV:          71 LV SV Index:   44 LVOT Area:     3.80 cm  LV Volumes (MOD) LV vol d, MOD A2C: 98.2 ml LV vol d, MOD A4C: 114.0 ml LV vol s, MOD A2C: 82.0 ml LV vol s, MOD A4C: 71.2 ml LV SV MOD A2C:     16.2 ml LV SV MOD A4C:     114.0 ml LV SV MOD BP:      33.1 ml RIGHT VENTRICLE             IVC RV S prime:     10.00 cm/s  IVC diam: 1.40 cm TAPSE (M-mode): 1.7 cm LEFT ATRIUM             Index        RIGHT ATRIUM          Index LA diam:        4.10 cm 2.52 cm/m   RA Area:     9.81 cm LA Vol (A2C):   26.5 ml 16.32 ml/m  RA Volume:   16.70 ml 10.28 ml/m LA Vol (A4C):   32.3 ml 19.89 ml/m LA Biplane Vol: 31.7 ml 19.52 ml/m  AORTIC VALVE LVOT Vmax:   97.60 cm/s LVOT Vmean:  62.900 cm/s LVOT VTI:    0.187 m  AORTA Ao Root diam: 3.20 cm Ao Asc diam:  3.80 cm MITRAL VALVE               TRICUSPID VALVE MV Area (PHT): 3.07 cm    TV Peak grad:   0.0 mmHg MV Decel Time: 247 msec    TV Vmax:        0.09 m/s MV E velocity: 55.48 cm/s  TR Peak grad:   25.4 mmHg MV A velocity: 65.12 cm/s  TR Vmax:        252.00 cm/s MV E/A ratio:  0.85                            SHUNTS                            Systemic VTI:  0.19 m                            Systemic Diam: 2.20 cm Morene Brownie Electronically signed by Morene Brownie Signature Date/Time: 01/01/2024/9:03:28 PM    Final     Scheduled Meds:  Chlorhexidine Gluconate Cloth  6 each Topical Daily   enoxaparin (LOVENOX) injection  40 mg Subcutaneous Q24H   fluticasone furoate-vilanterol  1 puff Inhalation Daily   folic acid  1 mg Oral Daily   furosemide  20 mg Intravenous TID PC   losartan  12.5 mg Oral Daily   metoprolol  succinate  50 mg Oral Daily   multivitamin with minerals  1 tablet Oral Daily   potassium chloride  40 mEq Oral Once   thiamine  100 mg Oral Q24H   Continuous Infusions:  doxycycline (VIBRAMYCIN) IV Stopped (01/03/24 0200)   thiamine (VITAMIN B1) injection Stopped (01/02/24 0845)     LOS: 2 days   Fredia Skeeter, MD [facility]  01/03/2024, 8:11 AM   *Please note that this is a verbal dictation therefore any spelling or grammatical errors are due to the Dragon Medical One system interpretation.  Please page via Amion and do not message via secure chat for urgent patient care matters. Secure chat can be used for non urgent patient care matters.  How to contact the [facility] Attending or Consulting provider 7A - 7P or covering provider during after hours 7P -7A, for this patient?  Check the care team in [facility] and look for a) attending/consulting [facility] provider listed and b) the [facility] team listed. Page or secure chat 7A-7P. Log into [email or web address] and use [facility] universal password to access. If you do not have the password, please contact the hospital operator. Locate the [facility] provider you are looking for under [facility] and page to a number that you can be directly reached. If you still have difficulty reaching the provider, please page the [facility] (Director on Call) for the Hospitalists listed on amion for assistance.

## 2024-01-03 NOTE — Plan of Care (Signed)

## 2024-01-03 NOTE — TOC CM/SW Note (Signed)
 Transition of Care (TOC) CM/SW Note    CSW attached outpatient substance use treatment services resources to patients AVS.

## 2024-01-04 ENCOUNTER — Other Ambulatory Visit (HOSPITAL_COMMUNITY): Payer: Self-pay

## 2024-01-04 ENCOUNTER — Telehealth (HOSPITAL_COMMUNITY): Payer: Self-pay

## 2024-01-04 ENCOUNTER — Encounter (HOSPITAL_COMMUNITY): Payer: Self-pay | Admitting: Cardiology

## 2024-01-04 DIAGNOSIS — J9601 Acute respiratory failure with hypoxia: Secondary | ICD-10-CM | POA: Diagnosis not present

## 2024-01-04 DIAGNOSIS — I509 Heart failure, unspecified: Secondary | ICD-10-CM | POA: Diagnosis not present

## 2024-01-04 LAB — BASIC METABOLIC PANEL WITH GFR
Anion gap: 18 — ABNORMAL HIGH (ref 5–15)
BUN: 23 mg/dL (ref 8–23)
CO2: 22 mmol/L (ref 22–32)
Calcium: 9 mg/dL (ref 8.9–10.3)
Chloride: 99 mmol/L (ref 98–111)
Creatinine, Ser: 0.73 mg/dL (ref 0.44–1.00)
GFR, Estimated: >60 mL/min (ref >60)
Glucose, Bld: 109 mg/dL — ABNORMAL HIGH (ref 70–99)
Potassium: 3.6 mmol/L (ref 3.5–5.1)
Sodium: 139 mmol/L (ref 135–145)

## 2024-01-04 LAB — T3: T3, Total: 117 ng/dL (ref 71–180)

## 2024-01-04 MED ORDER — LEVOFLOXACIN 750 MG PO TABS
750.0000 mg | ORAL_TABLET | ORAL | Status: DC
  Administered 2024-01-04: 750 mg via ORAL
  Filled 2024-01-04: qty 1

## 2024-01-04 MED ORDER — LOSARTAN POTASSIUM 25 MG PO TABS
25.0000 mg | ORAL_TABLET | Freq: Every day | ORAL | Status: DC
  Administered 2024-01-05: 25 mg via ORAL
  Filled 2024-01-04: qty 1

## 2024-01-04 MED ORDER — SPIRONOLACTONE 25 MG PO TABS: 25.0000 mg | ORAL_TABLET | Freq: Every day | ORAL | Status: DC

## 2024-01-04 NOTE — Evaluation (Signed)
 Physical Therapy Evaluation Patient Details Name: Wanda Dorsey MRN: [id number] DOB: 21-Oct-1945 Today's Date: 01/04/2024  History of Present Illness  Pt is a 78 y.o. female who presented 01/01/24 due to SOB and recently quit drinking. Pt was in ICU on BIPAP due to acute respiratory failure. Pt noted on chest xray 11/18 R basilar opacity Pna vs atelectasis?  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 PMH: hearing loss, HTN,  breast cancer with mastectomy, GI bleeding, COPD  Clinical Impression  Pt admitted with/for SOB and basilar opacity on CXT with PNA vs atelectasis.  Presently pt is weak and deconditioned needing minimal assist, notably fatigued and weakening over course of short assessment. .  Pt currently limited functionally due to the problems listed below.  (see problems list.)  Pt will benefit from PT to maximize function and safety to be able to get home safely with available assist .          If plan is discharge home, recommend the following: A little help with walking and/or transfers;A little help with bathing/dressing/bathroom;Assistance with feeding;Assist for transportation;Other (comment) (PRN assist)   Can travel by private vehicle        Equipment Recommendations    Recommendations for Other Services       Functional Status Assessment Patient has had a recent decline in their functional status and demonstrates the ability to make significant improvements in function in a reasonable and predictable amount of time.     Precautions / Restrictions Precautions Precautions: Fall Recall of Precautions/Restrictions: Intact Precaution/Restrictions Comments: watch, o2/bp Restrictions Weight Bearing Restrictions Per Provider Order: No      Mobility  Bed Mobility Overal bed mobility: Needs Assistance Bed Mobility: Supine to Sit, Sit to Supine     Supine to sit: Min assist Sit to supine: Contact guard assist   General bed mobility comments: Initially pt  needed assist due to not using R UE after being told no use for another 6 hours  (post procedure 11/19)    Transfers Overall transfer level: Needs assistance Equipment used: 1 person hand held assist Transfers: Sit to/from Stand Sit to Stand: Min assist, Contact guard assist           General transfer comment: pt asked for min steady assist, but not for BP changes, just feeling weak.    Ambulation/Gait Ambulation/Gait assistance: Min assist Gait Distance (Feet): 70 Feet Assistive device: 1 person hand held assist Gait Pattern/deviations: Step-through pattern   Gait velocity interpretation: <1.8 ft/sec, indicate of risk for recurrent falls   General Gait Details: mildly unsteady with some drift and tentative overall.   Had O2 available, but on RA.  Sats remained in the lower to middle 90's on RA except 88 briefly which looked to be due to noisy PLETH.  Pt was a little pale during both gait and when lying /sitting.  HR good in the 80's and 90's bpm  Stairs            Wheelchair Mobility     Tilt Bed    Modified Rankin (Stroke Patients Only)       Balance Overall balance assessment: Needs assistance Sitting-balance support: Feet supported Sitting balance-Leahy Scale: Good     Standing balance support: Bilateral upper extremity supported, No upper extremity supported Standing balance-Leahy Scale: Fair  Pertinent Vitals/Pain Pain Assessment Pain Assessment: No/denies pain    Home Living Family/patient expects to be discharged to:: Private residence Living Arrangements: Spouse/significant other Available Help at Discharge: Family Type of Home: House Home Access: Stairs to enter Entrance Stairs-Rails: Right;Left;Can reach both Secretary/administrator of Steps: 5 Alternate Level Stairs-Number of Steps: 13 Home Layout: Two level;1/2 bath on main level Home Equipment: Shower seat - built in;Grab bars - tub/shower       Prior Function Prior Level of Function : Independent/Modified Independent             Mobility Comments: indep ADLs Comments: indep     Extremity/Trunk Assessment   Upper Extremity Assessment Upper Extremity Assessment: Generalized weakness    Lower Extremity Assessment Lower Extremity Assessment: Generalized weakness       Communication   Communication Communication: No apparent difficulties Factors Affecting Communication: Hearing impaired (wears hearing aides)    Cognition Arousal: Alert Behavior During Therapy: Flat affect   PT - Cognitive impairments: No apparent impairments                       PT - Cognition Comments: but does defer to her son at times Following commands: Intact       Cueing Cueing Techniques: Verbal cues     General Comments      Exercises     Assessment/Plan    PT Assessment Patient needs continued PT services  PT Problem List Decreased strength;Decreased activity tolerance;Decreased balance;Decreased mobility;Cardiopulmonary status limiting activity;Decreased knowledge of use of DME       PT Treatment Interventions DME instruction;Gait training;Stair training;Functional mobility training;Therapeutic activities;Balance training;Patient/family education    PT Goals (Current goals can be found in the Care Plan section)  Acute Rehab PT Goals Patient Stated Goal: find out about what's going on with my heart.  home mod I PT Goal Formulation: With patient Time For Goal Achievement: 01/18/24 Potential to Achieve Goals: Good    Frequency Min 3X/week     Co-evaluation               AM-PAC PT 6 Clicks Mobility  Outcome Measure Help needed turning from your back to your side while in a flat bed without using bedrails?: A Little Help needed moving from lying on your back to sitting on the side of a flat bed without using bedrails?: A Little Help needed moving to and from a bed to a chair (including a  wheelchair)?: A Little Help needed standing up from a chair using your arms (e.g., wheelchair or bedside chair)?: A Little Help needed to walk in hospital room?: A Little Help needed climbing 3-5 steps with a railing? : A Little 6 Click Score: 18    End of Session Equipment Utilized During Treatment:  (reapplied O2 at end of the session) Activity Tolerance: Patient tolerated treatment well;Patient limited by fatigue Patient left: in bed;with call bell/phone within reach;with bed alarm set;with family/visitor present Nurse Communication: Mobility status PT Visit Diagnosis: Unsteadiness on feet (R26.81);Muscle weakness (generalized) (M62.81);Difficulty in walking, not elsewhere classified (R26.2)    Time: 8878-8853 PT Time Calculation (min) (ACUTE ONLY): 25 min   Charges:   PT Evaluation $PT Eval Moderate Complexity: 1 Mod PT Treatments $Gait Training: 8-22 mins PT General Charges $$ ACUTE PT VISIT: 1 Visit         01/04/2024  India HERO., PT Acute Rehabilitation Services [phone]  (office)  Vinie GAILS Kaeson Kleinert 01/04/2024, 12:26 PM

## 2024-01-04 NOTE — Plan of Care (Signed)
  Problem: Education: Goal: Knowledge of General Education information will improve Description: Including pain rating scale, medication(s)/side effects and non-pharmacologic comfort measures Outcome: Progressing   Problem: Health Behavior/Discharge Planning: Goal: Ability to manage health-related needs will improve Outcome: Progressing   Problem: Clinical Measurements: Goal: Ability to maintain clinical measurements within normal limits will improve Outcome: Progressing Goal: Will remain free from infection Outcome: Progressing Goal: Diagnostic test results will improve Outcome: Progressing Goal: Respiratory complications will improve Outcome: Progressing Goal: Cardiovascular complication will be avoided Outcome: Progressing   Problem: Activity: Goal: Risk for activity intolerance will decrease Outcome: Progressing   Problem: Nutrition: Goal: Adequate nutrition will be maintained Outcome: Progressing   Problem: Coping: Goal: Level of anxiety will decrease Outcome: Progressing   Problem: Elimination: Goal: Will not experience complications related to bowel motility Outcome: Progressing Goal: Will not experience complications related to urinary retention Outcome: Progressing   Problem: Pain Managment: Goal: General experience of comfort will improve and/or be controlled Outcome: Progressing   Problem: Safety: Goal: Ability to remain free from injury will improve Outcome: Progressing   Problem: Skin Integrity: Goal: Risk for impaired skin integrity will decrease Outcome: Progressing   Problem: Activity: Goal: Ability to return to baseline activity level will improve Outcome: Progressing   Problem: Cardiovascular: Goal: Vascular access site(s) Level 0-1 will be maintained Outcome: Progressing

## 2024-01-04 NOTE — Evaluation (Signed)
 Occupational Therapy Evaluation Patient Details Name: Wanda Dorsey MRN: [id number] DOB: February 20, 1945 Today's Date: 01/04/2024   History of Present Illness   Pt is a 78 y.o. female who presented 01/01/24 due to SOB and recently quit drinking. Pt was in ICU on BIPAP due to acute respiratory failure. Pt noted on chest xray 11/18 R basilar opacity Pna vs atelectasis?  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 PMH: hearing loss, HTN,  breast cancer with mastectomy, GI bleeding, COPD     Clinical Impressions Pt reported at PLOF they live with their husband and son (who is a engineer, civil (consulting)) in a two story home with 4-5 steps to enter the home with a rail on both sides to enter. Pt reported they normally do not use a walker and was reporting unsure if any walkers would fit in the doorframes of the house. At this time pt was sitting at EOB and wanted to attempt to ambulate and clean up. She ambulated with close CGA to sink and went into sitting position. However, once at sink pt started to not feel good saying it is probably because I did not eat  pt's BP was at 61/54 (58) HR 88 o2 93% on RA. She then was given HHA and ambulated back to bed and placed into a supine position.  Pt in session was mainly in 90-94% on Ra but at the end session when back in bed 86-90% so placed back on 2L and back up to 94% and nurse was made aware. At this time due to limited evaluation Acute Occupational Therapy to follow and assess further needs.   BP: Sitting at sink: 61/54 (58) hr 88 o2 93% on RA Supine: 101/65 (77) HR 90 o2 91% on RA Chair position in bed: 91/60 (72) HR 89 o2 88-90% on RA Repeat chair position: 95/59 (72) 91 HR o2 at 91% on RA       If plan is discharge home, recommend the following:   A little help with walking and/or transfers;A little help with bathing/dressing/bathroom;Assistance with cooking/housework;Assist for transportation;Help with stairs or ramp for entrance     Functional Status  Assessment   Patient has had a recent decline in their functional status and demonstrates the ability to make significant improvements in function in a reasonable and predictable amount of time.     Equipment Recommendations    (RW? depends on fot in the home)     Recommendations for Other Services         Precautions/Restrictions   Precautions Precautions: Fall Recall of Precautions/Restrictions: Intact Precaution/Restrictions Comments: watch, o2/bp Restrictions Weight Bearing Restrictions Per Provider Order: No     Mobility Bed Mobility Overal bed mobility: Needs Assistance Bed Mobility: Supine to Sit, Sit to Supine     Supine to sit: Contact guard Sit to supine: Contact guard assist   General bed mobility comments: pt when readjusting at the end of session required CGA    Transfers Overall transfer level: Needs assistance Equipment used: 1 person hand held assist Transfers: Sit to/from Stand Sit to Stand: Min assist, Contact guard assist           General transfer comment: Pt needed assist as BP started to decrase      Balance Overall balance assessment: Needs assistance Sitting-balance support: Feet supported Sitting balance-Leahy Scale: Good     Standing balance support: Bilateral upper extremity supported Standing balance-Leahy Scale: Fair Standing balance comment: due to BP  ADL either performed or assessed with clinical judgement   ADL Overall ADL's : Needs assistance/impaired Eating/Feeding: Independent;Sitting   Grooming: Wash/dry hands;Wash/dry face;Set up;Sitting   Upper Body Bathing: Set up;Sitting   Lower Body Bathing: Contact guard assist;Minimal assistance;Sit to/from stand   Upper Body Dressing : Set up;Sitting   Lower Body Dressing: Contact guard assist;Sit to/from stand   Toilet Transfer: Set up;Contact guard assist                   Vision Baseline Vision/History: 1 Wears  glasses Ability to See in Adequate Light: 0 Adequate Patient Visual Report: No change from baseline Vision Assessment?: Wears glasses for reading     Perception Perception: Within Functional Limits       Praxis Praxis: [facility]       Pertinent Vitals/Pain Pain Assessment Pain Assessment: No/denies pain     Extremity/Trunk Assessment Upper Extremity Assessment Upper Extremity Assessment: Generalized weakness   Lower Extremity Assessment Lower Extremity Assessment: Defer to PT evaluation       Communication Communication Communication: No apparent difficulties Factors Affecting Communication: Hearing impaired (wears hearing aides)   Cognition Arousal: Alert Behavior During Therapy: Flat affect Cognition: No apparent impairments                               Following commands: Intact       Cueing  General Comments   Cueing Techniques: Verbal cues      Exercises     Shoulder Instructions      Home Living Family/patient expects to be discharged to:: Private residence Living Arrangements: Spouse/significant other Available Help at Discharge: Family Type of Home: House Home Access: Stairs to enter Secretary/administrator of Steps: 5 Entrance Stairs-Rails: Right;Left;Can reach both Home Layout: Two level;1/2 bath on main level Alternate Level Stairs-Number of Steps: 13 Alternate Level Stairs-Rails: Right;Left Bathroom Shower/Tub: Walk-in shower         Home Equipment: Shower seat - built in;Grab bars - tub/shower          Prior Functioning/Environment Prior Level of Function : Independent/Modified Independent             Mobility Comments: indep ADLs Comments: indep    OT Problem List: Decreased strength;Decreased activity tolerance;Impaired balance (sitting and/or standing);Decreased safety awareness;Decreased knowledge of use of DME or AE   OT Treatment/Interventions: Self-care/ADL training;Therapeutic exercise;Energy  conservation;DME and/or AE instruction;Therapeutic activities;Patient/family education      OT Goals(Current goals can be found in the care plan section)   Acute Rehab OT Goals Patient Stated Goal: to go home OT Goal Formulation: With patient Time For Goal Achievement: 01/18/24 Potential to Achieve Goals: Fair   OT Frequency:  Min 2X/week    Co-evaluation              AM-PAC OT 6 Clicks Daily Activity     Outcome Measure Help from another person eating meals?: None Help from another person taking care of personal grooming?: A Little Help from another person toileting, which includes using toliet, bedpan, or urinal?: A Little Help from another person bathing (including washing, rinsing, drying)?: A Little Help from another person to put on and taking off regular upper body clothing?: None Help from another person to put on and taking off regular lower body clothing?: A Little 6 Click Score: 20   End of Session Equipment Utilized During Treatment: Gait belt Nurse Communication: Mobility status (bp/o2)  Activity Tolerance:  Other (comment) (bp) Patient left: in bed;with call bell/phone within reach;with chair alarm set  OT Visit Diagnosis: Unsteadiness on feet (R26.81);Other abnormalities of gait and mobility (R26.89);Repeated falls (R29.6);Muscle weakness (generalized) (M62.81)                Time: 9259-9168 OT Time Calculation (min): 51 min Charges:  OT General Charges $OT Visit: 1 Visit OT Evaluation $OT Eval Low Complexity: 1 Low OT Treatments $Self Care/Home Management : 23-37 mins  Warrick POUR OTR/L  Acute Rehab Services  [phone] office number   Warrick Berber 01/04/2024, 8:41 AM

## 2024-01-04 NOTE — Progress Notes (Signed)
  Progress Note  Patient Name: Wanda Dorsey Date of Encounter: 01/04/2024 [facility] Cardiologist: Wanda VEAR Ren Donley, MD   Interval Summary   No events overnight. She reports improvement in dyspnea but still feeling some tightness.  Vital Signs Vitals:   01/03/24 1836 01/03/24 2008 01/03/24 2313 01/04/24 0339  BP: 124/72 135/88 124/77 118/68  Pulse: 68 80 77 91  Resp: 18 20 16 16   Temp:  97.9 F (36.6 C) 98.4 F (36.9 C) 98.2 F (36.8 C)  TempSrc:  Oral Oral Oral  SpO2: 96% 97% 96% 95%  Weight:    58.3 kg  Height:        Intake/Output Summary (Last 24 hours) at 01/04/2024 0850 Last data filed at 01/04/2024 0345 Gross per 24 hour  Intake 450 ml  Output 2650 ml  Net -2200 ml      01/04/2024    3:39 AM 01/02/2024    5:00 AM 01/01/2024   10:10 PM  Last 3 Weights  Weight (lbs) 128 lb 8 oz 133 lb 6.1 oz 136 lb 0.4 oz  Weight (kg) 58.287 kg 60.5 kg 61.7 kg      Telemetry/ECG  NSR - Personally Reviewed  Physical Exam  GEN: No acute distress.   Neck: No JVD Cardiac: RRR, no murmurs, rubs, or gallops.  Respiratory: Clear to auscultation w/ bilateral crackles GI: Soft, nontender, non-distended  Wanda: No edema  Assessment & Plan  Wanda Dorsey is a 27 yoM with Hx of alcohol abuse, HTN, OSA who is presenting with worsening dyspnea and orthopnea c/w CHF. TTE with EF 30-35% and WMAs.   #New onset CHF (EF30-35% 11/25) #HTN #Alcohol abuse #Hx of rectal bleeding with CSY showing diverticulosis - Improvement in dyspnea but still volume up. LHC yesterday w/ normal coronaries and LVEDP 27 - Cont IV lasix  20 TID - Increase spiro 25 and losartan  25 - Cont empa 10 and XL 50 - We will continue to follow   For questions or updates, please contact [facility] Please consult [email or web address] for contact info under  Signed, Wanda VEAR Ren Donley, MD

## 2024-01-04 NOTE — Telephone Encounter (Signed)
 Pharmacy Patient Advocate Encounter  Insurance verification completed.    The patient is insured through [employer]. Patient has Medicare and is not eligible for a copay card, but may be able to apply for patient assistance or Medicare RX Payment Plan (Patient Must reach out to their plan, if eligible for payment plan), if available.    Ran test claim for Jardiance  10mg  and the current 30 day co-pay is $30.  Ran test claim for Generic Entresto 24-26mg  and the current 30 day co-pay is $10.  This test claim was processed through [employer]- copay amounts may vary at other pharmacies due to [employer], or as the patient moves through the different stages of their insurance plan.

## 2024-01-04 NOTE — Progress Notes (Signed)
   Heart Failure Stewardship Pharmacist Progress Note   PCP: Austin Mutton, MD PCP-Cardiologist: Joelle VEAR Ren Donley, MD    HPI:  78 yo F with PMH of alcohol abuse, HTN, OSA, breast cancer, and COPD.   Presented to the ED on 11/17 with shortness of breath since the Friday prior thought to be due to alcohol withdrawal.  CXR with vascular congestiona dn interstitial edema. BNP elevated. ECHO 11/17 with LVEF 30-35%, global hypokinesis, G2DD, RV normal, trivial MR. Taken for [facility] on 11/19 and found to have no significant CAD, LVEDP 27.  Met with patient at bedside. Reports improvement in breathing. No LE edema on exam. Does not feel back to baseline yet. Remains on IV lasix.   Current HF Medications: Diuretic: furosemide 20 mg IV TID Beta Blocker: metoprolol  XL 50 mg daily ACE/ARB/ARNI: losartan 25 mg daily MRA: spironolactone 25 mg daily SGLT2i: Jardiance 10 mg daily  Prior to admission HF Medications: Beta blocker: metoprolol  XL 200 mg daily ACE/ARB/ARNI: lisinopril  20 mg daily  Pertinent Lab Values: Serum creatinine 0.73, BUN 23, Potassium 3.6, Sodium 139, proBNP 3902, Magnesium 1.6 (11/19)  Vital Signs: Weight: 128 lbs (admission weight: 136 lbs) Blood pressure: 110/70s  Heart rate: 80-90s  I/O: net -1.8L yesterday; net -4.9L since admission  Medication Assistance / Insurance Benefits Check: Does the patient have prescription insurance?  Yes Type of insurance plan: Medicare   Outpatient Pharmacy:  Prior to admission outpatient pharmacy: [facility] Is the patient willing to use [facility] TOC pharmacy at discharge? Yes    Assessment: 1. Acute systolic CHF (LVEF 30-35%), due to NICM, alcoholic cardiomyopathy. NYHA class II symptoms. - Continue furosemide 20 mg IV TID. Strict I/Os and daily weights. Keep K>4 and Mg>2. Repeat magnesium level tomorrow AM. - Continue metoprolol  XL 50 mg daily - Agree with increasing losartan to 25 mg daily - Agree with increasing spironolactone to 25 mg  daily  - Continue Jardiance 10 mg daily   Plan: 1) Medication changes recommended at this time: - Check magnesium in AM  2) Patient assistance: - Entresto (generic) copay $10 - Jardiance copay $30  3)  Education  - Initial education completed - Full education to be completed prior to discharge  Duwaine Plant, PharmD, BCPS Heart Failure Engineer, Building Services Phone [phone]

## 2024-01-04 NOTE — Progress Notes (Signed)
 Speech Language Pathology Treatment: Dysphagia  Patient Details Name: Wanda Dorsey MRN: [id number] DOB: 13-Jun-1945 Today's Date: 01/04/2024 Time: 9071-9058 SLP Time Calculation (min) (ACUTE ONLY): 13 min  Assessment / Plan / Recommendation Clinical Impression  Patient was alert and awake. Patient complains of poor appetite. Patient shared she had poor appetite at baseline due to alcohol abuse. SLP observed patient eating breakfast of oatmeal and straw sips of thin liquids. Patient shared difficulty in finding food on the menu that she enjoys, SLP brought patient lemon icee as she shared lemon is her favorite. SLP contacted dietician to help patient try magic cup (supplemental ice cream). SLP educated patient on the importance of eating to help patient continue to recover and help patient's blood pressure improve. Patient and SLP in agreement that patient's swallowing has improved and SLP is signing off at this time.    HPI HPI: Patient is a 78 y.o. female with PMH: breast cancer, prior GI bleed, HTN, COPD, heart failure, GERD, ETOH abuse(1/2 bottle of vodka per day). She presented to the hospital on 01/01/2024 with acutely worsening of chronic SOB. Patient reported that SOB correlated with timing of her decision to abstain from ETOH use. She has been using Valium  to assist with ETOH weaning. In ED, she was placed on BiPAP but this was made PRN and she has been tolerating supplemental oxygen at 2L. CT angio/chest/PE showed 3mm nodule in left lower lobe, negative for PE, no consolidative process noted. Patient placed on regular thin diet following BSE on 11/17. 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      SLP Plan  All goals met          Recommendations  Diet recommendations: Regular;Thin liquid Liquids provided via: Cup;Straw Medication Administration: Other (Comment) (as tolerated; in pudding/puree per patient  preference) Supervision: Patient able to self feed Compensations: Slow rate;Small sips/bites Postural Changes and/or Swallow Maneuvers: Seated upright 90 degrees;Upright 30-60 min after meal                  Oral care BID     Dysphagia, unspecified (R13.10)     All goals met    Damien Hy  Graduate SLP Clinican

## 2024-01-04 NOTE — Progress Notes (Signed)
 PHARMACY NOTE:  ANTIMICROBIAL RENAL DOSAGE ADJUSTMENT  Current antimicrobial regimen includes a mismatch between antimicrobial dosage and estimated renal function.  As per policy approved by the Pharmacy & Therapeutics and Medical Executive Committees, the antimicrobial dosage will be adjusted accordingly.  Current antimicrobial dosage:  levofloxacin  750mg  q24h  Indication: PNA  Renal Function:  Estimated Creatinine Clearance: 45.1 mL/min (by C-G formula based on SCr of 0.73 mg/dL). []      On intermittent HD, scheduled: []      On CRRT    Antimicrobial dosage has been changed to:  q48h  Additional comments:   Thank you for allowing pharmacy to be a part of this patient's care.  Wanda Dorsey, [facility] [address] 01/04/2024 8:16 AM

## 2024-01-04 NOTE — Progress Notes (Signed)
 PROGRESS NOTE    Wanda Dorsey  FMW:[id number] DOB: 06-Jul-1945 DOA: 01/01/2024 PCP: Austin Mutton, MD   Brief Narrative:  78 year old female with past medical history of breast cancer, prior GI bleeding, hypertension and COPD with chronic dyspnea presented with worsening shortness of breath.  Patient was admitted to ICU on 01/01/2024 with acute respiratory failure with hypoxia requiring BiPAP.  Evaluation revealed pulmonary edema and elevated BNP.  Cardiology was consulted.   [facility] assumed care 11/18.  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  Echo shows EF of 30 to 35% and grade 2 diastolic dysfunction.  Cardiology consulted.  Patient has been getting IV Lasix for diuresis.  Transferred from [place] [facility] to [facility] for cardiac cath which was completed 01/03/2024 which showed normal coronaries.  Cardiology following, still overloaded, they have continued her on Lasix IV 20 mg 3 times daily as well as Aldactone and losartan.  Possible aspiration pneumonia: Repeat chest x-ray 01/02/2024 shows increased right basilar opacity which could be pneumonia or atelectasis.  Previous physicians suspected possible aspiration, started on doxycycline.  Now she is able to take p.o. medications, she is allergic to penicillin, unable to start on Augmentin, will start on Levaquin.  Hypokalemia: Replenished.  Resolved.  Hypomagnesemia: Replenished yesterday resolved.  Hypertension: Controlled, continue Toprol -XL and losartan.  Elevated TSH: Checking T3 and T4.  DVT prophylaxis: SCD's Start: 01/03/24 0917 enoxaparin (LOVENOX) injection 40 mg Start: 01/01/24 2200 SCDs Start: 01/01/24 9348   Code Status: Full Code  Family  Communication:  None present at bedside.  Plan of care discussed with patient in length and he/she verbalized understanding and agreed with it.  Status is: Inpatient Remains inpatient appropriate because: Cath today, will be discharged when cleared by cardiology   Estimated body mass index is 23.5 kg/m as calculated from the following:   Height as of this encounter: 5' 2 (1.575 m).   Weight as of this encounter: 58.3 kg.    Nutritional Assessment: Body mass index is 23.5 kg/m.SABRA Seen by dietician.  I agree with the assessment and plan as outlined below: Nutrition Status:        . Skin Assessment: I have examined the patient's skin and I agree with the wound assessment as performed by the wound care RN as outlined below:    Consultants:  Cardiology  Procedures:  As above  Antimicrobials:  Anti-infectives (From admission, onward)    Start     Dose/Rate Route Frequency Ordered Stop   01/04/24 1000  levofloxacin (LEVAQUIN) tablet 750 mg        750 mg Oral Every 48 hours 01/04/24 0808     01/01/24 2200  doxycycline (VIBRAMYCIN) 100 mg in sodium chloride  0.9 % 250 mL IVPB  Status:  Discontinued        100 mg 125 mL/hr over 120 Minutes Intravenous Every 12 hours 01/01/24 1713 01/04/24 0808   01/01/24 0545  cefTRIAXone (ROCEPHIN) 1 g in sodium chloride  0.9 % 100 mL IVPB        1 g 200 mL/hr over 30 Minutes Intravenous  Once 01/01/24 0530 01/01/24 0754   01/01/24 0545  doxycycline (VIBRAMYCIN) 100 mg in sodium chloride  0.9 % 250 mL IVPB        100 mg  125 mL/hr over 120 Minutes Intravenous  Once 01/01/24 0530 01/01/24 1300         Subjective: Patient seen and examined, son at the bedside.  Patient has no complaints other than generalized weakness.  Objective: Vitals:   01/03/24 2008 01/03/24 2313 01/04/24 0339 01/04/24 1200  BP: 135/88 124/77 118/68 99/68  Pulse: 80 77 91   Resp: 20 16 16    Temp: 97.9 F (36.6 C) 98.4 F (36.9 C) 98.2 F (36.8 C) 98.6 F (37 C)   TempSrc: Oral Oral Oral Oral  SpO2: 97% 96% 95%   Weight:   58.3 kg   Height:        Intake/Output Summary (Last 24 hours) at 01/04/2024 1327 Last data filed at 01/04/2024 0345 Gross per 24 hour  Intake 200 ml  Output 1450 ml  Net -1250 ml   Filed Weights   01/01/24 2210 01/02/24 0500 01/04/24 0339  Weight: 61.7 kg 60.5 kg 58.3 kg    Examination:  General exam: Appears calm and comfortable  Respiratory system: Clear to auscultation. Respiratory effort normal. Cardiovascular system: S1 & S2 heard, RRR. No JVD, murmurs, rubs, gallops or clicks. No pedal edema. Gastrointestinal system: Abdomen is nondistended, soft and nontender. No organomegaly or masses felt. Normal bowel sounds heard. Central nervous system: Alert and oriented. No focal neurological deficits. Extremities: Symmetric 5 x 5 power. Skin: No rashes, lesions or ulcers.  Psychiatry: Judgement and insight appear normal. Mood & affect appropriate.    Data Reviewed: I have personally reviewed following labs and imaging studies  CBC: Recent Labs  Lab 01/01/24 0353 01/01/24 0426 01/01/24 0958 01/02/24 0309 01/03/24 0423  WBC 12.2*  --  9.2 10.8* 11.0*  HGB 14.9 15.6* 14.1 13.4 14.1  HCT 45.5 46.0 43.3 40.9 42.2  MCV 99.8  --  99.1 98.8 98.4  PLT 115*  --  112* 115* 111*   Basic Metabolic Panel: Recent Labs  Lab 01/01/24 0353 01/01/24 0426 01/01/24 0603 01/02/24 0309 01/03/24 0423 01/03/24 1110 01/04/24 0420  NA 143 139  --  141 139  --  139  K 4.0 5.3*  --  3.7 3.3*  --  3.6  CL 104 107  --  102 100  --  99  CO2 28  --   --  28 27  --  22  GLUCOSE 126* 129*  --  119* 103*  --  109*  BUN 20 29*  --  22 27*  --  23  CREATININE 0.68 0.70 0.55 0.67 0.93  --  0.73  CALCIUM 9.5  --   --  9.1 8.4*  --  9.0  MG  --   --   --  1.9  --  1.6*  --    GFR: Estimated Creatinine Clearance: 45.1 mL/min (by C-G formula based on SCr of 0.73 mg/dL). Liver Function Tests: Recent Labs  Lab 01/01/24 0353  AST  101*  ALT 70*  ALKPHOS 91  BILITOT 0.9  PROT 7.0  ALBUMIN 3.9   No results for input(s): LIPASE, AMYLASE in the last 168 hours. No results for input(s): AMMONIA in the last 168 hours. Coagulation Profile: No results for input(s): INR, PROTIME in the last 168 hours. Cardiac Enzymes: No results for input(s): CKTOTAL, CKMB, CKMBINDEX, TROPONINI in the last 168 hours. BNP (last 3 results) Recent Labs    01/01/24 0403  PROBNP 3,902.0*   HbA1C: No results for input(s): HGBA1C in the last 72 hours. CBG: No results for input(s):  GLUCAP in the last 168 hours. Lipid Profile: No results for input(s): CHOL, HDL, LDLCALC, TRIG, CHOLHDL, LDLDIRECT in the last 72 hours.  Thyroid Function Tests: Recent Labs    01/03/24 0423 01/03/24 1110  TSH 4.653*  --   FREET4  --  0.91   Anemia Panel: No results for input(s): VITAMINB12, FOLATE, FERRITIN, TIBC, IRON, RETICCTPCT in the last 72 hours. Sepsis Labs: Recent Labs  Lab 01/01/24 0421 01/02/24 0309  PROCALCITON  --  <0.10  LATICACIDVEN 1.3  --     Recent Results (from the past 240 hours)  Resp panel by RT-PCR (RSV, Flu A&B, Covid) Anterior Nasal Swab     Status: None   Collection Time: 01/01/24  3:53 AM   Specimen: Anterior Nasal Swab  Result Value Ref Range Status   SARS Coronavirus 2 by RT PCR NEGATIVE NEGATIVE Final    Comment: (NOTE) SARS-CoV-2 target nucleic acids are NOT DETECTED.  The SARS-CoV-2 RNA is generally detectable in upper respiratory specimens during the acute phase of infection. The lowest concentration of SARS-CoV-2 viral copies this assay can detect is 138 copies/mL. A negative result does not preclude SARS-Cov-2 infection and should not be used as the sole basis for treatment or other patient management decisions. A negative result may occur with  improper specimen collection/handling, submission of specimen other than nasopharyngeal swab, presence of viral  mutation(s) within the areas targeted by this assay, and inadequate number of viral copies(<138 copies/mL). A negative result must be combined with clinical observations, patient history, and epidemiological information. The expected result is Negative.  Fact Sheet for Patients:  bloggercourse.com  Fact Sheet for Healthcare Providers:  seriousbroker.it  This test is no t yet approved or cleared by the [place]  FDA and  has been authorized for detection and/or diagnosis of SARS-CoV-2 by FDA under an Emergency Use Authorization (EUA). This EUA will remain  in effect (meaning this test can be used) for the duration of the COVID-19 declaration under Section 564(b)(1) of the Act, 21 U.S.C.section 360bbb-3(b)(1), unless the authorization is terminated  or revoked sooner.       Influenza A by PCR NEGATIVE NEGATIVE Final   Influenza B by PCR NEGATIVE NEGATIVE Final    Comment: (NOTE) The Xpert Xpress SARS-CoV-2/FLU/RSV plus assay is intended as an aid in the diagnosis of influenza from Nasopharyngeal swab specimens and should not be used as a sole basis for treatment. Nasal washings and aspirates are unacceptable for Xpert Xpress SARS-CoV-2/FLU/RSV testing.  Fact Sheet for Patients: bloggercourse.com  Fact Sheet for Healthcare Providers: seriousbroker.it  This test is not yet approved or cleared by the [place]  FDA and has been authorized for detection and/or diagnosis of SARS-CoV-2 by FDA under an Emergency Use Authorization (EUA). This EUA will remain in effect (meaning this test can be used) for the duration of the COVID-19 declaration under Section 564(b)(1) of the Act, 21 U.S.C. section 360bbb-3(b)(1), unless the authorization is terminated or revoked.     Resp Syncytial Virus by PCR NEGATIVE NEGATIVE Final    Comment: (NOTE) Fact Sheet for  Patients: bloggercourse.com  Fact Sheet for Healthcare Providers: seriousbroker.it  This test is not yet approved or cleared by the [place]  FDA and has been authorized for detection and/or diagnosis of SARS-CoV-2 by FDA under an Emergency Use Authorization (EUA). This EUA will remain in effect (meaning this test can be used) for the duration of the COVID-19 declaration under Section 564(b)(1) of the Act, 21 U.S.C. section 360bbb-3(b)(1), unless the  authorization is terminated or revoked.  Performed at [facility], [address], [place], [place] [address]   Respiratory (~20 pathogens) panel by PCR     Status: None   Collection Time: 01/01/24  3:53 AM   Specimen: Nasopharyngeal Swab; Respiratory  Result Value Ref Range Status   Adenovirus NOT DETECTED NOT DETECTED Final   Coronavirus 229E NOT DETECTED NOT DETECTED Final    Comment: (NOTE) The Coronavirus on the Respiratory Panel, DOES NOT test for the novel  Coronavirus (2019 nCoV)    Coronavirus HKU1 NOT DETECTED NOT DETECTED Final   Coronavirus NL63 NOT DETECTED NOT DETECTED Final   Coronavirus OC43 NOT DETECTED NOT DETECTED Final   Metapneumovirus NOT DETECTED NOT DETECTED Final   Rhinovirus / Enterovirus NOT DETECTED NOT DETECTED Final   Influenza A NOT DETECTED NOT DETECTED Final   Influenza B NOT DETECTED NOT DETECTED Final   Parainfluenza Virus 1 NOT DETECTED NOT DETECTED Final   Parainfluenza Virus 2 NOT DETECTED NOT DETECTED Final   Parainfluenza Virus 3 NOT DETECTED NOT DETECTED Final   Parainfluenza Virus 4 NOT DETECTED NOT DETECTED Final   Respiratory Syncytial Virus NOT DETECTED NOT DETECTED Final   Bordetella pertussis NOT DETECTED NOT DETECTED Final   Bordetella Parapertussis NOT DETECTED NOT DETECTED Final   Chlamydophila pneumoniae NOT DETECTED NOT DETECTED Final   Mycoplasma pneumoniae NOT DETECTED NOT DETECTED Final    Comment:  Performed at [facility], [address], [place], [place] [address]  Culture, blood (routine x 2)     Status: None (Preliminary result)   Collection Time: 01/01/24  5:50 AM   Specimen: BLOOD LEFT FOREARM  Result Value Ref Range Status   Specimen Description   Final    BLOOD LEFT FOREARM Performed at [facility], [address], [place], [place] [address]    Special Requests   Final    BOTTLES DRAWN AEROBIC AND ANAEROBIC Blood Culture adequate volume Performed at [facility], [address], [place], [place] [address]    Culture   Final    NO GROWTH 3 DAYS Performed at [facility], [address] [place], [place] [address]    Report Status PENDING  Incomplete  Culture, blood (routine x 2)     Status: None (Preliminary result)   Collection Time: 01/01/24  5:54 AM   Specimen: BLOOD LEFT HAND  Result Value Ref Range Status   Specimen Description   Final    BLOOD LEFT HAND Performed at [facility], [address], [place], [place] [address]    Special Requests   Final    BOTTLES DRAWN AEROBIC AND ANAEROBIC Blood Culture adequate volume Performed at [facility], [address] [place], [place] [address]    Culture   Final    NO GROWTH 3 DAYS Performed at [facility], [address], [place], [place] [address]    Report Status PENDING  Incomplete  MRSA Next Gen by PCR, Nasal     Status: None   Collection Time: 01/01/24  8:56 AM   Specimen: Nasal Swab  Result Value Ref Range Status   MRSA by PCR Next Gen NOT DETECTED NOT DETECTED Final    Comment: (NOTE) The GeneXpert MRSA Assay (FDA approved for NASAL specimens only), is one component of a comprehensive MRSA colonization surveillance program. It is not intended to diagnose MRSA infection nor to guide or monitor treatment for MRSA  infections. Test performance is not FDA approved in patients less than 4 years old. Performed at [facility], [address], [place], [place] [address]      Radiology Studies: CARDIAC CATHETERIZATION Result Date: 01/03/2024 Coronary angiography 01/03/2024: LM: Normal LAD: Normal, no significant disease Lcx: Normal, no significant disease RCA: Dominant. 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 Multiplanar reformatted images are provided for review. Automated exposure control, iterative reconstruction, and/or weight based adjustment of the mA/kV was utilized to reduce the radiation dose to as low as reasonably achievable. COMPARISON: None available. CLINICAL HISTORY: Abnormal xray - lung opacity/opacities FINDINGS: MEDIASTINUM: Heart and pericardium are unremarkable. At least 3-vessel coronary artery calcifications. The thoracic aorta and pulmonary artery are normal in caliber. At least moderate thoracic aorta atherosclerotic plaque. The central airways are clear. LYMPH NODES: Status post right axillary lymph node dissection. No gross hilar adenopathy with limited evaluation on this noncontrast study. No mediastinal lymphadenopathy. LUNGS AND PLEURA: Bilateral lower lobe opacities. Right upper lobe linear scarring. Small right and trace left pleural effusions. No pneumothorax. SOFT TISSUES/BONES: Slight interval increase in size of right anterolateral 3-5 expansible sclerotic ossification lesion (7:84). 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 UPPER ABDOMEN: Limited images of the upper abdomen demonstrates no acute abnormality. IMPRESSION: 1. Small right and trace left pleural effusions. 2. Bilateral lower lobe opacities, possibly atelectasis or  infection, with right upper lobe linear scarring noted. 3. Slight interval increase in size of the right anterolateral rib 35 expansile sclerotic lesion. Consider MRI with and without contrast for further evaluation if concerned for chondrosarcoma. 4. At least moderate thoracic aortic atherosclerotic plaque and multivessel coronary artery calcifications. Electronically signed by: Morgane Naveau MD 01/03/2024 12:43 AM EST RP Workstation: [id number]    Scheduled Meds:  empagliflozin  10 mg Oral Daily   enoxaparin (LOVENOX) injection  40 mg Subcutaneous Q24H   fluticasone furoate-vilanterol  1 puff Inhalation Daily   folic acid  1 mg Oral Daily   furosemide  20 mg Intravenous TID PC   levofloxacin  750 mg Oral Q48H   losartan  25 mg Oral Daily   metoprolol  succinate  50 mg Oral Daily   multivitamin with minerals  1 tablet Oral Daily   sodium chloride  flush  3 mL Intravenous Q12H   spironolactone  25 mg Oral Daily   thiamine  100 mg Oral Q24H   Continuous Infusions:  thiamine (VITAMIN B1) injection Stopped (01/02/24 0845)     LOS: 3 days   Fredia Skeeter, MD [facility]  01/04/2024, 1:27 PM   *Please note that this is a verbal dictation therefore any spelling or grammatical errors are due to the Dragon Medical One system interpretation.  Please page via Amion and do not message via secure chat for urgent patient care matters. Secure chat can be used for non urgent patient care matters.  How to contact the [facility] Attending or Consulting provider 7A - 7P or covering provider during after hours 7P -7A, for this patient?  Check the care team in [facility] and look for a) attending/consulting [facility] provider listed and b) the [facility] team listed. Page or secure chat 7A-7P. Log into [email or web address] and use [facility] universal password to access.  If you do not have the password, please contact the hospital operator. Locate the [facility] provider you are looking for under [facility] and page to a  number that you can be directly reached. If you still have difficulty reaching the provider, please page the [facility] (Director on Call) for the Hospitalists listed on amion for assistance.

## 2024-01-05 ENCOUNTER — Other Ambulatory Visit (HOSPITAL_COMMUNITY): Payer: Self-pay

## 2024-01-05 DIAGNOSIS — I509 Heart failure, unspecified: Secondary | ICD-10-CM | POA: Diagnosis not present

## 2024-01-05 LAB — BASIC METABOLIC PANEL WITH GFR
Anion gap: 15 (ref 5–15)
BUN: 26 mg/dL — ABNORMAL HIGH (ref 8–23)
CO2: 20 mmol/L — ABNORMAL LOW (ref 22–32)
Calcium: 8.7 mg/dL — ABNORMAL LOW (ref 8.9–10.3)
Chloride: 101 mmol/L (ref 98–111)
Creatinine, Ser: 0.7 mg/dL (ref 0.44–1.00)
GFR, Estimated: >60 mL/min (ref >60)
Glucose, Bld: 103 mg/dL — ABNORMAL HIGH (ref 70–99)
Potassium: 4 mmol/L (ref 3.5–5.1)
Sodium: 136 mmol/L (ref 135–145)

## 2024-01-05 LAB — LIPOPROTEIN A (LPA): Lipoprotein (a): 48.8 nmol/L — ABNORMAL HIGH (ref <75.0)

## 2024-01-05 LAB — MAGNESIUM: Magnesium: 2.2 mg/dL (ref 1.7–2.4)

## 2024-01-05 MED ORDER — FUROSEMIDE 20 MG PO TABS
20.0000 mg | ORAL_TABLET | Freq: Every day | ORAL | Status: DC
  Administered 2024-01-05: 20 mg via ORAL
  Filled 2024-01-05: qty 1

## 2024-01-05 MED ORDER — LEVOFLOXACIN 750 MG PO TABS
750.0000 mg | ORAL_TABLET | ORAL | 0 refills | Status: AC
  Filled 2024-01-05: qty 4, 8d supply, fill #0

## 2024-01-05 MED ORDER — SPIRONOLACTONE 12.5 MG HALF TABLET
12.5000 mg | ORAL_TABLET | Freq: Every day | ORAL | Status: DC
  Filled 2024-01-05: qty 1

## 2024-01-05 MED ORDER — METOPROLOL SUCCINATE ER 25 MG PO TB24
12.5000 mg | ORAL_TABLET | Freq: Every day | ORAL | Status: DC
  Filled 2024-01-05: qty 1

## 2024-01-05 MED ORDER — FUROSEMIDE 40 MG PO TABS: 40.0000 mg | ORAL_TABLET | Freq: Every day | ORAL | Status: DC

## 2024-01-05 MED ORDER — LORATADINE 10 MG PO TABS
10.0000 mg | ORAL_TABLET | Freq: Every day | ORAL | Status: DC
  Administered 2024-01-05: 10 mg via ORAL
  Filled 2024-01-05: qty 1

## 2024-01-05 MED ORDER — FUROSEMIDE 20 MG PO TABS
20.0000 mg | ORAL_TABLET | Freq: Every day | ORAL | 0 refills | Status: DC
  Filled 2024-01-05: qty 30, 30d supply, fill #0

## 2024-01-05 MED ORDER — EMPAGLIFLOZIN 10 MG PO TABS
10.0000 mg | ORAL_TABLET | Freq: Every day | ORAL | 0 refills | Status: DC
  Filled 2024-01-05: qty 30, 30d supply, fill #0

## 2024-01-05 NOTE — Progress Notes (Signed)
   Heart Failure Stewardship Pharmacist Progress Note   PCP: Austin Mutton, MD PCP-Cardiologist: Joelle VEAR Ren Donley, MD    HPI:  78 yo F with PMH of alcohol abuse, HTN, OSA, breast cancer, and COPD.   Presented to the ED on 11/17 with shortness of breath since the Friday prior thought to be due to alcohol withdrawal.  CXR with vascular congestiona dn interstitial edema. BNP elevated. ECHO 11/17 with LVEF 30-35%, global hypokinesis, G2DD, RV normal, trivial MR. Taken for [facility] on 11/19 and found to have no significant CAD, LVEDP 27.  Met with patient at bedside. IV lasix  doses (all 3) held yesterday. Reports improvement in dizziness and lightheadedness. No SOB. No LE edema on exam.   Current HF Medications: Diuretic: furosemide  20 mg PO daily ACE/ARB/ARNI: losartan  25 mg daily SGLT2i: Jardiance  10 mg daily  Prior to admission HF Medications: Beta blocker: metoprolol  XL 200 mg daily ACE/ARB/ARNI: lisinopril  20 mg daily  Pertinent Lab Values: Serum creatinine 0.70, BUN 26, Potassium 4.0, Sodium 136, proBNP 3902, Magnesium  2.2  Vital Signs: Weight: 126 lbs (admission weight: 136 lbs) Blood pressure: 90-100/70s  Heart rate: 90s  I/O: net -0.4L yesterday; net -4.9L since admission  Medication Assistance / Insurance Benefits Check: Does the patient have prescription insurance?  Yes Type of insurance plan: Medicare   Outpatient Pharmacy:  Prior to admission outpatient pharmacy: [facility] Is the patient willing to use [facility] TOC pharmacy at discharge? Yes    Assessment: 1. Acute systolic CHF (LVEF 30-35%), due to NICM, alcoholic cardiomyopathy. NYHA class II symptoms. - Agree with transitioning to furosemide  20 mg PO daily. Strict I/Os and daily weights. Keep K>4 and Mg>2.  - Holding metoprolol  XL 50 mg daily ans spironolactone  25 mg daily with low BP and dizziness - Continue losartan  to 25 mg daily - Continue Jardiance  10 mg daily   Plan: 1) Medication changes recommended at this  time: - Restart spiro tomorrow pending BP  2) Patient assistance: - Entresto (generic) copay $10 Jardiance  copay $30  3)  Education  - Patient has been educated on current HF medications and potential additions to HF medication regimen - Patient verbalizes understanding that over the next few months, these medication doses may change and more medications may be added to optimize HF regimen - Patient has been educated on basic disease state pathophysiology and goals of therapy   Duwaine Plant, PharmD, BCPS Heart Failure Stewardship Pharmacist Phone [phone]

## 2024-01-05 NOTE — Progress Notes (Signed)
 Occupational Therapy Treatment Patient Details Name: Wanda Dorsey MRN: [id number] DOB: 12/21/1945 Today's Date: 01/05/2024   History of present illness Pt is a 78 y.o. female who presented 01/01/24 due to SOB and recently quit drinking. Pt was in ICU on BIPAP due to acute respiratory failure. Pt noted on chest xray 11/18 R basilar opacity Pna vs atelectasis?  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 PMH: hearing loss, HTN,  breast cancer with mastectomy, GI bleeding, COPD   OT comments  Pt making steady progress toward goals. Pt with unsteady gait however improves significantly with use of RW. Pt able to ambulate the the bathroom, retrieve her clothing, get dressed and ambulate @ 160 ft @ RW level with CGA and vc for safety. BP sitting 104/75; after activity 133/82. Spoke with son over the phone who confirmed he can provide 24/7 assistance for his Mom. Educated son on importance on using gait belt (they have several) with RW and placing 3in1 by bed at night to reduce risk of falls. Son verbalized understanding. Acute OT will continue to follow.      If plan is discharge home, recommend the following:  A little help with walking and/or transfers;A little help with bathing/dressing/bathroom;Assistance with cooking/housework;Direct supervision/assist for medications management   Equipment Recommendations  BSC/3in1;Other (comment) (RW)    Recommendations for Other Services      Precautions / Restrictions Precautions Precautions: Fall Recall of Precautions/Restrictions: Intact       Mobility Bed Mobility Overal bed mobility: Modified Independent                  Transfers Overall transfer level: Needs assistance   Transfers: Sit to/from Stand Sit to Stand: Contact guard assist           General transfer comment: vc to scoot forward; hand placement on RW     Balance Overall balance assessment: Needs assistance   Sitting balance-Leahy Scale: Good        Standing balance-Leahy Scale: Poor                             ADL either performed or assessed with clinical judgement   ADL Overall ADL's : Needs assistance/impaired Eating/Feeding: Independent   Grooming: Supervision/safety;Standing   Upper Body Bathing: Set up;Sitting   Lower Body Bathing: Contact guard assist;Sit to/from stand   Upper Body Dressing : Set up;Sitting   Lower Body Dressing: Contact guard assist;Sit to/from stand   Toilet Transfer: Contact guard assist;Ambulation;Rolling walker (2 wheels)   Toileting- Clothing Manipulation and Hygiene: Supervision/safety;Sit to/from stand       Functional mobility during ADLs: Contact guard assist;Rolling walker (2 wheels);Cueing for safety      Extremity/Trunk Assessment Upper Extremity Assessment Upper Extremity Assessment: Generalized weakness            Vision   Vision Assessment?: Wears glasses for reading   Perception     Praxis     Communication     Cognition Arousal: Alert Behavior During Therapy: Flat affect Cognition: No apparent impairments             OT - Cognition Comments: decreased safety/judgement at times                 Following commands: Intact        Cueing      Exercises      Shoulder Instructions  General Comments Ambulated 160 ft with CGA; I just haven't been out of bed much    Pertinent Vitals/ Pain       Pain Assessment Pain Assessment: No/denies pain  Home Living                                          Prior Functioning/Environment              Frequency  Min 2X/week        Progress Toward Goals  OT Goals(current goals can now be found in the care plan section)  Progress towards OT goals: Progressing toward goals  Acute Rehab OT Goals Patient Stated Goal: home asap OT Goal Formulation: With patient/family Time For Goal Achievement: 01/18/24 Potential to Achieve Goals: Good ADL Goals Pt Will  Perform Upper Body Bathing: Independently Pt Will Perform Lower Body Bathing: with modified independence Pt Will Perform Upper Body Dressing: Independently Pt Will Perform Lower Body Dressing: with modified independence Pt Will Transfer to Toilet: with modified independence Pt Will Perform Toileting - Clothing Manipulation and hygiene: with modified independence Pt Will Perform Tub/Shower Transfer: with modified independence  Plan      Co-evaluation                 AM-PAC OT 6 Clicks Daily Activity     Outcome Measure   Help from another person eating meals?: None Help from another person taking care of personal grooming?: A Little Help from another person toileting, which includes using toliet, bedpan, or urinal?: A Little Help from another person bathing (including washing, rinsing, drying)?: A Little Help from another person to put on and taking off regular upper body clothing?: A Little Help from another person to put on and taking off regular lower body clothing?: A Little 6 Click Score: 19    End of Session Equipment Utilized During Treatment: Gait belt;Rolling walker (2 wheels)  OT Visit Diagnosis: Unsteadiness on feet (R26.81);Muscle weakness (generalized) (M62.81)   Activity Tolerance Patient tolerated treatment well   Patient Left in bed;with call bell/phone within reach;with bed alarm set   Nurse Communication Mobility status;Other (comment) (DC needs)        Time: 8499-8454 OT Time Calculation (min): 45 min  Charges: OT General Charges $OT Visit: 1 Visit OT Treatments $Self Care/Home Management : 38-52 mins  Kreg Sink, OT/L   Acute OT Clinical Specialist Acute Rehabilitation Services Pager [phone] Office [phone]   [facility] 01/05/2024, 3:54 PM

## 2024-01-05 NOTE — TOC Progression Note (Addendum)
 Transition of Care [facility]) - Progression Note    Patient Details  Name: Wanda Dorsey MRN: [id number] Date of Birth: Apr 24, 1945  [facility] CM/SW Contact  Graves-Bigelow, Erminio Deems, RN Phone Number: 01/05/2024, 3:15 PM  Clinical Narrative:  ICM spoke with patient regarding PT/OT recommendations for home health. Patient asked ICM to call her son and discuss agency choice. Son chose [facility] and clinicals submitted via the hub. [facility] has accepted and will visit the patient within 24-48 hours of discharge. DME rolling walker ordered via [employer] and  will be delivered to the room. ICM spoke with MD and he states that he re consulted PT and they will see the patient soon.   01-05-24 PT/OT did re consult and have recommended [facility] PT/OT, Aide services. [facility] willing to accept all disciplines-orders to follow. Son states he still works and is looking into personal care services for the patient. DME bsc ordered for the home. No further needs identified.   Expected Discharge Plan: Home w Home Health Services Barriers to Discharge: Continued Medical Work up               Expected Discharge Plan and Services   Discharge Planning Services: CM Consult Post Acute Care Choice: NA Living arrangements for the past 2 months: Single Family Home                 DME Arranged: Walker rolling   Date DME Agency Contacted: 01/05/24 Time DME Agency Contacted: [phone] Representative spoke with at DME Agency: Hub HH Arranged: PT HH Agency: [facility] Date [facility] Agency Contacted: 01/05/24 Time HH Agency Contacted: [phone] Representative spoke with at [facility] Agency: [facility]   Social Drivers of Health (SDOH) Interventions SDOH Screenings   Food Insecurity: No Food Insecurity (01/01/2024)  Housing: Low Risk  (01/01/2024)  Transportation Needs: No Transportation Needs (01/01/2024)  Utilities: Not At Risk (01/01/2024)  Alcohol Screen: Low Risk  (01/03/2024)  Financial Resource Strain: Low  Risk  (01/03/2024)  Social Connections: Socially Integrated (01/01/2024)  Tobacco Use: Medium Risk (01/01/2024)    Readmission Risk Interventions     No data to display

## 2024-01-05 NOTE — Progress Notes (Addendum)
 DC paperwor, med list, and post cath site care reviewed with pt who verbalized understanding.  All questions answered.  IV and tele removed.  All belongings returned.  Pt had home DME equipment delivered to the bedside (BSC/RW).  Will stop at [facility] for RX pickup.   Pt's son here at the main circle for pickup.

## 2024-01-05 NOTE — Care Management Important Message (Signed)
 Important Message  Patient Details  Name: Wanda Dorsey MRN: [id number] Date of Birth: 1945-05-27   Important Message Given:  Yes - Medicare IM     Vonzell Arrie Sharps 01/05/2024, 10:50 AM

## 2024-01-05 NOTE — Discharge Summary (Signed)
 Physician Discharge Summary  Wanda Dorsey FMW:[id number] DOB: 1945-07-18 DOA: 01/01/2024  PCP: Austin Mutton, MD  Admit date: 01/01/2024 Discharge date: 01/05/2024 30 Day Unplanned Readmission Risk Score    Flowsheet Row ED to Hosp-Admission (Current) from 01/01/2024 in [facility]  30 Day Unplanned Readmission Risk Score (%) 12.51 Filed at 01/05/2024 1200    This score is the patient's risk of an unplanned readmission within 30 days of being discharged (0 -100%). The score is based on dignosis, age, lab data, medications, orders, and past utilization.   Low:  0-14.9   Medium: 15-21.9   High: 22-29.9   Extreme: 30 and above          Admitted From: Home Disposition: Home  Recommendations for Outpatient Follow-up:  Follow up with PCP in 1-2 weeks Please obtain BMP/CBC in one week Follow-up with cardiology outpatient-they will schedule appointment. Please follow up with your PCP on the following pending results: Unresulted Labs (From admission, onward)     Start     Ordered   01/08/24 0500  Creatinine, serum  (enoxaparin  (LOVENOX )    CrCl >/= 30 ml/min)  Weekly,   R     Comments: while on enoxaparin  therapy    01/01/24 0652              Home Health: yes Equipment/Devices: Multiple DME's/rolling walker and bedside commode  Discharge Condition: Stable CODE STATUS: Full code Diet recommendation:  Diet Order             Diet Heart Room service appropriate? Yes; Fluid consistency: Thin  Diet effective now                   Subjective: Seen and examined, other than generalized weakness, no other complaint.  Denies palpitation, chest pain, dizziness or shortness of breath.  Eager to go home.  Brief/Interim Summary: 78 year old female with past medical history of breast cancer, prior GI bleeding, hypertension and COPD with chronic dyspnea presented with worsening shortness of breath.  Patient was admitted to ICU on 01/01/2024 with acute respiratory  failure with hypoxia requiring BiPAP.  Evaluation revealed pulmonary edema and elevated BNP.  Cardiology was consulted. [facility] assumed care 11/18.  Details below.   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  Echo shows EF of 30 to 35% and grade 2 diastolic dysfunction.  Cardiology consulted.  Patient was diuresed with IV Lasix .  Transferred [facility] for cardiac cath which was completed 01/03/2024 which showed normal coronaries.  Patient was still overloaded, was given 3 times daily IV Lasix .  Started on losartan  and Aldactone .  However patient's blood pressure remained on the low side so cardiology recommended discontinuing Aldactone , beta-blocker, clonidine  and however they started her on Jardiance  and recommended discharging on Lasix  20 mg p.o. daily.  They will follow-up with her outpatient.  She has been weaned to room air.  We also checked her ambulatory oximetry, she is not requiring oxygen.  Hypotension/orthostatic hypotension: Patient was found to have orthostatic hypotension with dizziness yesterday, for that reason, she was observed overnight.  Today, she is orthostatic negative and she is asymptomatic however her blood pressure systolic remains between 95/100.  Unsure if this is going to be her new normal.  Cardiology feels comfortable letting patient go and they will consider  and initiate GDMT as outpatient.   Possible aspiration pneumonia: Repeat chest x-ray 01/02/2024 shows increased right basilar opacity which could be pneumonia or atelectasis.  Previous physicians suspected possible aspiration, started on doxycycline .  Now she is able to take p.o. medications, she is allergic to penicillin, so I started her on Levaquin  and will discharge on 4 more days to complete the  course.   Hypokalemia: Replenished.  Resolved.   Hypomagnesemia: Replenished/resolved   Striae of hypertension.  Due to low normal blood pressure, discontinue losartan , clonidine  and Toprol -XL.   Subclinical hypothyroidism: TSH only slightly elevated at 4.6, normal T3 and T4.  Generalized weakness: Patient continued to complain of weakness, requiring assistance with standing up at the bedside.  Patient was evaluated by PT OT yesterday, they recommended home health.  I was very concerned about her ability to perform daily ADLs.  Reconsulted PT OT, they reassessed her, once again, they recommended home health PT OT, they also spoke to the patient's son who happens to be the nurse and confirmed that patient will have 24/7 supervision by the son.  Discharge Diagnoses:  Principal Problem:   Acute hypoxemic respiratory failure [facility])    Discharge Instructions   Allergies as of 01/05/2024       Reactions   Other Itching   Tree nuts- itching Hot peppers- jalapeno, etc..- get rash around mouth    Penicillins Rash, Other (See Comments)   Reaction: unknown  Has patient had a PCN reaction causing immediate rash, facial/tongue/throat swelling, SOB or lightheadedness with hypotension: unknown Has patient had a PCN reaction causing severe rash involving mucus membranes or skin necrosis: unknown Has patient had a PCN reaction that required hospitalization: no Has patient had a PCN reaction occurring within the last 10 years: no If all of the above answers are NO, then may proceed with Cephalosporin use.        Medication List     STOP taking these medications    cloNIDine  0.1 MG tablet Commonly known as: CATAPRES    lisinopril  20 MG tablet Commonly known as: ZESTRIL    metoprolol  200 MG 24 hr tablet Commonly known as: TOPROL -XL   metoprolol  tartrate 100 MG tablet Commonly known as: LOPRESSOR        TAKE these medications    anastrozole  1 MG tablet Commonly known as:  ARIMIDEX  Take 1 mg by mouth daily.   Breyna 160-4.5 MCG/ACT inhaler Generic drug: budesonide-formoterol Inhale 2 puffs into the lungs 2 (two) times daily.   cetirizine 10 MG tablet Commonly known as: ZYRTEC Take 10 mg by mouth daily as needed for allergies.   cholecalciferol  1000 units tablet Commonly known as: VITAMIN D  Take 5,000 Units by mouth daily.   empagliflozin  10 MG Tabs tablet Commonly known as: JARDIANCE  Take 1 tablet (10 mg total) by mouth daily. Start taking on: January 06, 2024   furosemide  20 MG tablet Commonly known as: LASIX  Take 1 tablet (20 mg total) by mouth daily. Start taking on: January 06, 2024   ipratropium-albuterol  0.5-2.5 (3) MG/3ML Soln Commonly known as: DUONEB Take 3 mLs by nebulization. Every 6-8 Hours PRN   levofloxacin  750 MG tablet Commonly known as: LEVAQUIN  Take 1 tablet (750 mg total) by mouth every other day for 4 days. Start taking on: January 06, 2024   LORazepam  0.5 MG tablet Commonly known as: ATIVAN  Take 0.5 mg by mouth 3 (three) times daily as needed.   multivitamin with minerals Tabs tablet Take 1 tablet by mouth daily.   naproxen  sodium 220 MG tablet Commonly known as: ALEVE Take 220 mg by mouth 2 (two) times daily as needed (pain).   ProAir  RespiClick 108 (90 Base) MCG/ACT Aepb Generic drug: Albuterol  Sulfate Take 2 puffs by mouth 4 (four) times daily as needed (sob/wheezing).   thiamine  100 MG tablet Commonly known as: VITAMIN B1 Take 200 mg by mouth daily.               Durable Medical Equipment  (From admission, onward)           Start     Ordered   01/05/24 1548  For home use only DME Bedside commode  Once       Question:  Patient needs a bedside commode to treat with the following condition  Answer:  General weakness   01/05/24 1547   01/05/24 1500  For home use only DME Walker rolling  Once       Question Answer Comment  Walker: With 5 Inch Wheels   Patient needs a walker to treat with  the following condition General weakness      01/05/24 1500            Contact information for follow-up providers     [facility]. Go in 4 day(s).   Specialty: Cardiology Why: Hospital follow up 01/09/2024 @ 11 am PLEASE bring a current medication list to appointment FREE valet parking, Entrance C, off [employer] [address] [facility] [employer] entrance. Contact information: [address] [place]  [address] [phone]        Ren Donley Joelle VEAR, MD Follow up.   Specialty: Cardiology Why: Hospital follow-up with General Cardiology scheduled for 01/31/2024 at 9:20am. Please arrive 20 minutes early for check-in. If this date/ time does not work for you, please call our office to reschedule. Contact information: [address] [place] [address] [phone]         Austin Mutton, MD Follow up in 1 week(s).   Specialty: Internal Medicine Contact information: [address] [place] [address] [phone]              Contact information for after-discharge care     Home Medical Care     [facility]) .   Service: Home Health Services Why: Physical, Occupational Therapy, Aide-office to call with visit times. Contact information: [address] [place]  [address] [phone]                    Allergies  Allergen Reactions   Other Itching    Tree nuts- itching Hot peppers- jalapeno, etc..- get rash around mouth    Penicillins Rash and Other (See Comments)    Reaction: unknown  Has patient had a PCN reaction causing immediate rash, facial/tongue/throat swelling, SOB or lightheadedness with hypotension: unknown Has patient had a PCN reaction causing severe rash involving mucus membranes or skin necrosis: unknown Has patient had a PCN reaction that required hospitalization: no Has patient had a  PCN reaction occurring within the last 10 years: no If all of the above answers are NO, then may proceed with Cephalosporin use.     Consultations: Cardiology   Procedures/Studies: CARDIAC CATHETERIZATION Result Date: 01/03/2024 Coronary angiography 01/03/2024: LM: Normal LAD: Normal, no significant disease Lcx: Normal, no significant disease RCA: Dominant. 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 Multiplanar reformatted images are provided for review. Automated exposure control, iterative reconstruction, and/or weight based adjustment of the mA/kV was utilized to reduce the radiation dose to as low as reasonably achievable. COMPARISON: None available. CLINICAL HISTORY: Abnormal xray - lung opacity/opacities FINDINGS: MEDIASTINUM: Heart and pericardium are unremarkable. At least 3-vessel coronary artery calcifications. The thoracic aorta and pulmonary artery are normal in caliber. At least moderate thoracic aorta atherosclerotic plaque. The central airways are clear. LYMPH NODES: Status post right axillary lymph node dissection. No gross hilar adenopathy with limited evaluation on this noncontrast study. No mediastinal lymphadenopathy. LUNGS AND PLEURA: Bilateral lower lobe opacities. Right upper lobe linear scarring. Small right and trace left pleural effusions. No pneumothorax. SOFT TISSUES/BONES: Slight interval increase in size of right anterolateral 3-5 expansible sclerotic ossification lesion (7:84). 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 UPPER ABDOMEN: Limited images of the upper abdomen demonstrates no acute abnormality. IMPRESSION: 1.  Small right and trace left pleural effusions. 2. Bilateral lower lobe opacities, possibly atelectasis or infection, with right upper lobe linear scarring noted. 3. Slight interval increase in size of the right anterolateral rib 35 expansile sclerotic lesion. Consider MRI with and without contrast for further evaluation if concerned for chondrosarcoma. 4. At least moderate thoracic aortic atherosclerotic plaque and multivessel coronary artery calcifications. Electronically signed by: Morgane Naveau MD 01/03/2024 12:43 AM EST RP Workstation: [id number]   DG Chest Port 1 View Result Date: 01/02/2024 CLINICAL DATA:  Hypoxia EXAM: PORTABLE CHEST 1 VIEW COMPARISON:  01/01/2024 FINDINGS: Low lung volumes are present, causing crowding of the pulmonary vasculature. Increased airspace opacity peripherally at the right lung base. Older low ultrasound revealed a right pleural effusion and this may represent atelectasis or pneumonia in conjunction with the pleural effusion. Irregular calcified lesion of the right anterior third rib as on prior chest CT of 01/14/2023, unchanged Suspected small left pleural effusion. Mildly improved retrocardiac aeration compared to yesterday. Mild enlargement of the cardiopericardial silhouette. Right axillary clips and right mastectomy. IMPRESSION: 1. Increased airspace opacity peripherally at the right lung base, potentially from atelectasis or pneumonia in conjunction with the pleural effusion. 2. Suspected small left pleural effusion. 3. Mild enlargement of the cardiopericardial silhouette. 4. Irregular calcified lesion of the right anterior third rib as on prior chest CT of 01/14/2023, unchanged. Electronically Signed   By: Ryan Salvage M.D.   On: 01/02/2024 09:40   US  Abdomen Limited RUQ (LIVER/GB) Result Date: 01/02/2024 CLINICAL DATA:  Transaminitis EXAM: ULTRASOUND ABDOMEN LIMITED RIGHT UPPER QUADRANT COMPARISON:  Overlapping portions CT chest 01/14/2023 FINDINGS:  Gallbladder: No gallstones or wall thickening visualized. No sonographic Murphy sign noted by sonographer. Common bile duct: Diameter: 0.5 cm, within normal limits for age Liver: No focal lesion identified. Coarse echogenic liver with poor sonic penetration compatible with diffuse hepatic steatosis. Portal vein is patent on color Doppler imaging with normal direction of blood flow towards the liver. Other: Right pleural effusion. Limitations: ICU patient, no left lateral decubitus imaging available IMPRESSION: 1. Coarse echogenic liver with poor sonic penetration compatible with diffuse hepatic steatosis. 2. Right pleural effusion. Electronically Signed   By: Ryan Salvage M.D.   On: 01/02/2024 09:28   ECHOCARDIOGRAM COMPLETE Result Date: 01/01/2024    ECHOCARDIOGRAM REPORT   Patient Name:   RISHIKA MCCOLLOM [facility]  Date of Exam: 01/01/2024 Medical Rec #:  [id number]      Height:       62.0 in Accession #:    [id number]     Weight:       136.2 lb Date of Birth:  [birth year]      BSA:          1.624 m Patient Age:    78 years       BP:           105/70 mmHg Patient Gender: F              HR:           54 bpm. Exam Location:  Inpatient Procedure: 2D Echo, Cardiac Doppler, Color Doppler and Intracardiac            Opacification Agent (Both Spectral and Color Flow Doppler were            utilized during procedure). Indications:    R94.31 Abnormal EKG; I50.40* Unspecified combined systolic                 (congestive) and diastolic (congestive) heart failure  History:        Patient has no prior history of Echocardiogram examinations.                 COPD, Signs/Symptoms:Shortness of Breath, Dyspnea and Syncope;                 Risk Factors:Sleep Apnea and Hypertension. ETOH withdrawals.                 Breast cancer.  Sonographer:    Ellouise Mose RDCS Referring Phys: [id number] JESSICA MARSHALL IMPRESSIONS  1. Left ventricular ejection fraction, by estimation, is 30 to 35%. The left ventricle has moderately decreased function.  The left ventricle demonstrates global hypokinesis. Left ventricular diastolic parameters are consistent with Grade II diastolic dysfunction (pseudonormalization).  2. Right ventricular systolic function is normal. The right ventricular size is normal. There is normal pulmonary artery systolic pressure. The estimated right ventricular systolic pressure is 33.4 mmHg.  3. Moderate pleural effusion.  4. The mitral valve is normal in structure. Trivial mitral valve regurgitation. No evidence of mitral stenosis.  5. The aortic valve is normal in structure. Aortic valve regurgitation is not visualized. No aortic stenosis is present.  6. The inferior vena cava is normal in size with greater than 50% respiratory variability, suggesting right atrial pressure of 3 mmHg. Comparison(s): No prior Echocardiogram. FINDINGS  Left Ventricle: Left ventricular ejection fraction, by estimation, is 30 to 35%. The left ventricle has moderately decreased function. The left ventricle demonstrates global hypokinesis. Definity  contrast agent was given IV to delineate the left ventricular endocardial borders. The left ventricular internal cavity size was normal in size. There is no left ventricular hypertrophy. Left ventricular diastolic parameters are consistent with Grade II diastolic dysfunction (pseudonormalization). Right Ventricle: The right ventricular size is normal. No increase in right ventricular wall thickness. Right ventricular systolic function is normal. There is normal pulmonary artery systolic pressure. The tricuspid regurgitant velocity is 2.52 m/s, and  with an assumed right atrial pressure of 8 mmHg, the estimated right ventricular systolic pressure is 33.4 mmHg. Left Atrium: Left atrial size was normal in size. Right Atrium: Right atrial size was normal in size. Pericardium: There is no evidence of pericardial effusion. Mitral Valve: The mitral valve is normal in structure. Trivial mitral valve regurgitation. No evidence  of mitral valve  stenosis. Tricuspid Valve: The tricuspid valve is normal in structure. Tricuspid valve regurgitation is mild . No evidence of tricuspid stenosis. Aortic Valve: The aortic valve is normal in structure. Aortic valve regurgitation is not visualized. No aortic stenosis is present. Pulmonic Valve: The pulmonic valve was normal in structure. Pulmonic valve regurgitation is not visualized. No evidence of pulmonic stenosis. Aorta: The aortic root is normal in size and structure. Venous: The inferior vena cava is normal in size with greater than 50% respiratory variability, suggesting right atrial pressure of 3 mmHg. IAS/Shunts: No atrial level shunt detected by color flow Doppler. Additional Comments: There is a moderate pleural effusion.  LEFT VENTRICLE PLAX 2D LVIDd:         4.60 cm      Diastology LVIDs:         3.80 cm      LV e' medial:    2.83 cm/s LV PW:         1.10 cm      LV E/e' medial:  19.6 LV IVS:        0.95 cm      LV e' lateral:   4.46 cm/s LVOT diam:     2.20 cm      LV E/e' lateral: 12.4 LV SV:         71 LV SV Index:   44 LVOT Area:     3.80 cm  LV Volumes (MOD) LV vol d, MOD A2C: 98.2 ml LV vol d, MOD A4C: 114.0 ml LV vol s, MOD A2C: 82.0 ml LV vol s, MOD A4C: 71.2 ml LV SV MOD A2C:     16.2 ml LV SV MOD A4C:     114.0 ml LV SV MOD BP:      33.1 ml RIGHT VENTRICLE             IVC RV S prime:     10.00 cm/s  IVC diam: 1.40 cm TAPSE (M-mode): 1.7 cm LEFT ATRIUM             Index        RIGHT ATRIUM          Index LA diam:        4.10 cm 2.52 cm/m   RA Area:     9.81 cm LA Vol (A2C):   26.5 ml 16.32 ml/m  RA Volume:   16.70 ml 10.28 ml/m LA Vol (A4C):   32.3 ml 19.89 ml/m LA Biplane Vol: 31.7 ml 19.52 ml/m  AORTIC VALVE LVOT Vmax:   97.60 cm/s LVOT Vmean:  62.900 cm/s LVOT VTI:    0.187 m  AORTA Ao Root diam: 3.20 cm Ao Asc diam:  3.80 cm MITRAL VALVE               TRICUSPID VALVE MV Area (PHT): 3.07 cm    TV Peak grad:   0.0 mmHg MV Decel Time: 247 msec    TV Vmax:        0.09 m/s  MV E velocity: 55.48 cm/s  TR Peak grad:   25.4 mmHg MV A velocity: 65.12 cm/s  TR Vmax:        252.00 cm/s MV E/A ratio:  0.85                            SHUNTS  Systemic VTI:  0.19 m                            Systemic Diam: 2.20 cm Morene Brownie Electronically signed by Morene Brownie Signature Date/Time: 01/01/2024/9:03:28 PM    Final    DG Chest Port 1 View Result Date: 01/01/2024 EXAM: 1 VIEW(S) XRAY OF THE CHEST 01/01/2024 03:58:33 AM COMPARISON: Portable chest and CTA chest both 01/14/2023. CLINICAL HISTORY: SOB SOB FINDINGS: LUNGS AND PLEURA: Increased central vascular prominence. Generalized interstitial consolidation consistent with edema. Ill-defined right upper lobe perihilar architectural distortion approaching 2.6 cm question vascular summation versus parenchymal mass. Small pleural effusions. No pneumothorax. HEART AND MEDIASTINUM: Stable mediastinum. Aortic tortuosity and atherosclerosis. BONES AND SOFT TISSUES: Right axillary surgical clips. Healed fractures of the right anterior third through fifth ribs with hypertrophic callus, previously seen and unchanged. No new osseous findings. IMPRESSION: 1. Chf or fluid overload  with vascular congestion and interstitial edema. . 2. Small pleural effusions. 3. Progress chest films recommended depending on clinical response. 4. Right upper lobe perihilar architectural distortion/mass or vascular summation artifact. Attention on follow-up film recommended. Electronically signed by: Francis Quam MD 01/01/2024 04:13 AM EST RP Workstation: [id number]     Discharge Exam: Vitals:   01/05/24 0848 01/05/24 1300  BP:  97/71  Pulse: 91 99  Resp: 18 16  Temp:  97.8 F (36.6 C)  SpO2: 100% 93%   Vitals:   01/05/24 0547 01/05/24 0814 01/05/24 0848 01/05/24 1300  BP: (!) 97/58   97/71  Pulse: 89  91 99  Resp: 15  18 16   Temp: 98.1 F (36.7 C)   97.8 F (36.6 C)  TempSrc: Oral   Oral  SpO2: 98% 97% 100% 93%   Weight: 57.2 kg     Height:        General: Pt is alert, awake, not in acute distress Cardiovascular: RRR, S1/S2 +, no rubs, no gallops Respiratory: CTA bilaterally, no wheezing, no rhonchi Abdominal: Soft, NT, ND, bowel sounds + Extremities: no edema, no cyanosis    The results of significant diagnostics from this hospitalization (including imaging, microbiology, ancillary and laboratory) are listed below for reference.     Microbiology: Recent Results (from the past 240 hours)  Resp panel by RT-PCR (RSV, Flu A&B, Covid) Anterior Nasal Swab     Status: None   Collection Time: 01/01/24  3:53 AM   Specimen: Anterior Nasal Swab  Result Value Ref Range Status   SARS Coronavirus 2 by RT PCR NEGATIVE NEGATIVE Final    Comment: (NOTE) SARS-CoV-2 target nucleic acids are NOT DETECTED.  The SARS-CoV-2 RNA is generally detectable in upper respiratory specimens during the acute phase of infection. The lowest concentration of SARS-CoV-2 viral copies this assay can detect is 138 copies/mL. A negative result does not preclude SARS-Cov-2 infection and should not be used as the sole basis for treatment or other patient management decisions. A negative result may occur with  improper specimen collection/handling, submission of specimen other than nasopharyngeal swab, presence of viral mutation(s) within the areas targeted by this assay, and inadequate number of viral copies(<138 copies/mL). A negative result must be combined with clinical observations, patient history, and epidemiological information. The expected result is Negative.  Fact Sheet for Patients:  bloggercourse.com  Fact Sheet for Healthcare Providers:  seriousbroker.it  This test is no t yet approved or cleared by the [place]  FDA and  has been authorized for detection and/or  diagnosis of SARS-CoV-2 by FDA under an Emergency Use Authorization (EUA). This EUA will  remain  in effect (meaning this test can be used) for the duration of the COVID-19 declaration under Section 564(b)(1) of the Act, 21 U.S.C.section 360bbb-3(b)(1), unless the authorization is terminated  or revoked sooner.       Influenza A by PCR NEGATIVE NEGATIVE Final   Influenza B by PCR NEGATIVE NEGATIVE Final    Comment: (NOTE) The Xpert Xpress SARS-CoV-2/FLU/RSV plus assay is intended as an aid in the diagnosis of influenza from Nasopharyngeal swab specimens and should not be used as a sole basis for treatment. Nasal washings and aspirates are unacceptable for Xpert Xpress SARS-CoV-2/FLU/RSV testing.  Fact Sheet for Patients: bloggercourse.com  Fact Sheet for Healthcare Providers: seriousbroker.it  This test is not yet approved or cleared by the [place]  FDA and has been authorized for detection and/or diagnosis of SARS-CoV-2 by FDA under an Emergency Use Authorization (EUA). This EUA will remain in effect (meaning this test can be used) for the duration of the COVID-19 declaration under Section 564(b)(1) of the Act, 21 U.S.C. section 360bbb-3(b)(1), unless the authorization is terminated or revoked.     Resp Syncytial Virus by PCR NEGATIVE NEGATIVE Final    Comment: (NOTE) Fact Sheet for Patients: bloggercourse.com  Fact Sheet for Healthcare Providers: seriousbroker.it  This test is not yet approved or cleared by the [place]  FDA and has been authorized for detection and/or diagnosis of SARS-CoV-2 by FDA under an Emergency Use Authorization (EUA). This EUA will remain in effect (meaning this test can be used) for the duration of the COVID-19 declaration under Section 564(b)(1) of the Act, 21 U.S.C. section 360bbb-3(b)(1), unless the authorization is terminated or revoked.  Performed at [facility], [address], [place], [place]  [address]   Respiratory (~20 pathogens) panel by PCR     Status: None   Collection Time: 01/01/24  3:53 AM   Specimen: Nasopharyngeal Swab; Respiratory  Result Value Ref Range Status   Adenovirus NOT DETECTED NOT DETECTED Final   Coronavirus 229E NOT DETECTED NOT DETECTED Final    Comment: (NOTE) The Coronavirus on the Respiratory Panel, DOES NOT test for the novel  Coronavirus (2019 nCoV)    Coronavirus HKU1 NOT DETECTED NOT DETECTED Final   Coronavirus NL63 NOT DETECTED NOT DETECTED Final   Coronavirus OC43 NOT DETECTED NOT DETECTED Final   Metapneumovirus NOT DETECTED NOT DETECTED Final   Rhinovirus / Enterovirus NOT DETECTED NOT DETECTED Final   Influenza A NOT DETECTED NOT DETECTED Final   Influenza B NOT DETECTED NOT DETECTED Final   Parainfluenza Virus 1 NOT DETECTED NOT DETECTED Final   Parainfluenza Virus 2 NOT DETECTED NOT DETECTED Final   Parainfluenza Virus 3 NOT DETECTED NOT DETECTED Final   Parainfluenza Virus 4 NOT DETECTED NOT DETECTED Final   Respiratory Syncytial Virus NOT DETECTED NOT DETECTED Final   Bordetella pertussis NOT DETECTED NOT DETECTED Final   Bordetella Parapertussis NOT DETECTED NOT DETECTED Final   Chlamydophila pneumoniae NOT DETECTED NOT DETECTED Final   Mycoplasma pneumoniae NOT DETECTED NOT DETECTED Final    Comment: Performed at [facility], [address], [place], [place] [address]  Culture, blood (routine x 2)     Status: None (Preliminary result)   Collection Time: 01/01/24  5:50 AM   Specimen: BLOOD LEFT FOREARM  Result Value Ref Range Status   Specimen Description   Final    BLOOD LEFT FOREARM Performed at  [facility], [address] [place], [place] [address]    Special Requests   Final    BOTTLES DRAWN AEROBIC AND ANAEROBIC Blood Culture adequate volume Performed at [facility], [address], [place], [place] [address]    Culture   Final    NO GROWTH 4 DAYS Performed at [facility],  [address] [place] [address], [place], [place] [address]    Report Status PENDING  Incomplete  Culture, blood (routine x 2)     Status: None (Preliminary result)   Collection Time: 01/01/24  5:54 AM   Specimen: BLOOD LEFT HAND  Result Value Ref Range Status   Specimen Description   Final    BLOOD LEFT HAND Performed at [facility], [address] [place], [place] [address]    Special Requests   Final    BOTTLES DRAWN AEROBIC AND ANAEROBIC Blood Culture adequate volume Performed at [facility], [address] [place], [place] [address]    Culture   Final    NO GROWTH 4 DAYS Performed at [facility], [address] [place], [place] [address]    Report Status PENDING  Incomplete  MRSA Next Gen by PCR, Nasal     Status: None   Collection Time: 01/01/24  8:56 AM   Specimen: Nasal Swab  Result Value Ref Range Status   MRSA by PCR Next Gen NOT DETECTED NOT DETECTED Final    Comment: (NOTE) The GeneXpert MRSA Assay (FDA approved for NASAL specimens only), is one component of a comprehensive MRSA colonization surveillance program. It is not intended to diagnose MRSA infection nor to guide or monitor treatment for MRSA infections. Test performance is not FDA approved in patients less than 54 years old. Performed at [facility], [address], [place], [place] [address]      Labs: BNP (last 3 results) Recent Labs    01/14/23 0931  BNP 111.0*   Basic Metabolic Panel: Recent Labs  Lab 01/01/24 0353 01/01/24 0426 01/01/24 0603 01/02/24 0309 01/03/24 0423 01/03/24 1110 01/04/24 0420 01/05/24 0421  NA 143 139  --  141 139  --  139 136  K 4.0 5.3*  --  3.7 3.3*  --  3.6 4.0  CL 104 107  --  102 100  --  99 101  CO2 28  --   --  28 27  --  22 20*  GLUCOSE 126* 129*  --  119* 103*  --  109* 103*  BUN 20 29*  --  22 27*  --  23 26*  CREATININE 0.68 0.70 0.55 0.67 0.93  --  0.73 0.70  CALCIUM 9.5  --   --  9.1 8.4*  --  9.0 8.7*  MG  --    --   --  1.9  --  1.6*  --  2.2   Liver Function Tests: Recent Labs  Lab 01/01/24 0353  AST 101*  ALT 70*  ALKPHOS 91  BILITOT 0.9  PROT 7.0  ALBUMIN 3.9   No results for input(s): LIPASE, AMYLASE in the last 168 hours. No results for input(s): AMMONIA in the last 168 hours. CBC: Recent Labs  Lab 01/01/24 0353 01/01/24 0426 01/01/24 0958 01/02/24 0309 01/03/24 0423  WBC 12.2*  --  9.2 10.8* 11.0*  HGB 14.9 15.6* 14.1 13.4 14.1  HCT 45.5 46.0 43.3 40.9 42.2  MCV 99.8  --  99.1 98.8 98.4  PLT 115*  --  112* 115* 111*   Cardiac Enzymes: No results for input(s): CKTOTAL, CKMB, CKMBINDEX, TROPONINI in the last 168 hours. BNP: Invalid input(s): POCBNP CBG: No results for input(s): GLUCAP in the last 168 hours. D-Dimer No results for input(s): DDIMER in the last 72 hours. Hgb A1c No results for input(s): HGBA1C in the last 72 hours. Lipid Profile No results for input(s): CHOL, HDL, LDLCALC, TRIG, CHOLHDL, LDLDIRECT in the last 72 hours. Thyroid function studies Recent Labs    01/03/24 0423  TSH 4.653*   Anemia work up No results for input(s): VITAMINB12, FOLATE, FERRITIN, TIBC, IRON, RETICCTPCT in the last 72 hours. Urinalysis    Component Value Date/Time   COLORURINE YELLOW 08/07/2014 2046   APPEARANCEUR CLOUDY (A) 08/07/2014 2046   LABSPEC 1.011 08/07/2014 2046   PHURINE 7.0 08/07/2014 2046   GLUCOSEU NEGATIVE 08/07/2014 2046   HGBUR NEGATIVE 08/07/2014 2046   BILIRUBINUR NEGATIVE 08/07/2014 2046   KETONESUR NEGATIVE 08/07/2014 2046   PROTEINUR NEGATIVE 08/07/2014 2046   UROBILINOGEN 0.2 08/07/2014 2046   NITRITE NEGATIVE 08/07/2014 2046   LEUKOCYTESUR SMALL (A) 08/07/2014 2046   Sepsis Labs Recent Labs  Lab 01/01/24 0353 01/01/24 0958 01/02/24 0309 01/03/24 0423  WBC 12.2* 9.2 10.8* 11.0*   Microbiology Recent Results (from the past 240 hours)  Resp panel by RT-PCR (RSV, Flu A&B, Covid) Anterior  Nasal Swab     Status: None   Collection Time: 01/01/24  3:53 AM   Specimen: Anterior Nasal Swab  Result Value Ref Range Status   SARS Coronavirus 2 by RT PCR NEGATIVE NEGATIVE Final    Comment: (NOTE) SARS-CoV-2 target nucleic acids are NOT DETECTED.  The SARS-CoV-2 RNA is generally detectable in upper respiratory specimens during the acute phase of infection. The lowest concentration of SARS-CoV-2 viral copies this assay can detect is 138 copies/mL. A negative result does not preclude SARS-Cov-2 infection and should not be used as the sole basis for treatment or other patient management decisions. A negative result may occur with  improper specimen collection/handling, submission of specimen other than nasopharyngeal swab, presence of viral mutation(s) within the areas targeted by this assay, and inadequate number of viral copies(<138 copies/mL). A negative result must be combined with clinical observations, patient history, and epidemiological information. The expected result is Negative.  Fact Sheet for Patients:  bloggercourse.com  Fact Sheet for Healthcare Providers:  seriousbroker.it  This test is no t yet approved or cleared by the [place]  FDA and  has been authorized for detection and/or diagnosis of SARS-CoV-2 by FDA under an Emergency Use Authorization (EUA). This EUA will remain  in effect (meaning this test can be used) for the duration of the COVID-19 declaration under Section 564(b)(1) of the Act, 21 U.S.C.section 360bbb-3(b)(1), unless the authorization is terminated  or revoked sooner.       Influenza A by PCR NEGATIVE NEGATIVE Final   Influenza B by PCR NEGATIVE NEGATIVE Final    Comment: (NOTE) The Xpert Xpress SARS-CoV-2/FLU/RSV plus assay is intended as an aid in the diagnosis of influenza from Nasopharyngeal swab specimens and should not be used as a sole basis for treatment. Nasal washings  and aspirates are unacceptable for Xpert Xpress SARS-CoV-2/FLU/RSV testing.  Fact Sheet for Patients: bloggercourse.com  Fact Sheet for Healthcare Providers: seriousbroker.it  This test is not yet approved or cleared by the [place]  FDA and has been authorized for detection and/or diagnosis of SARS-CoV-2 by FDA under an Emergency Use Authorization (EUA). This  EUA will remain in effect (meaning this test can be used) for the duration of the COVID-19 declaration under Section 564(b)(1) of the Act, 21 U.S.C. section 360bbb-3(b)(1), unless the authorization is terminated or revoked.     Resp Syncytial Virus by PCR NEGATIVE NEGATIVE Final    Comment: (NOTE) Fact Sheet for Patients: bloggercourse.com  Fact Sheet for Healthcare Providers: seriousbroker.it  This test is not yet approved or cleared by the [place]  FDA and has been authorized for detection and/or diagnosis of SARS-CoV-2 by FDA under an Emergency Use Authorization (EUA). This EUA will remain in effect (meaning this test can be used) for the duration of the COVID-19 declaration under Section 564(b)(1) of the Act, 21 U.S.C. section 360bbb-3(b)(1), unless the authorization is terminated or revoked.  Performed at [facility], [address], [place], [place] [address]   Respiratory (~20 pathogens) panel by PCR     Status: None   Collection Time: 01/01/24  3:53 AM   Specimen: Nasopharyngeal Swab; Respiratory  Result Value Ref Range Status   Adenovirus NOT DETECTED NOT DETECTED Final   Coronavirus 229E NOT DETECTED NOT DETECTED Final    Comment: (NOTE) The Coronavirus on the Respiratory Panel, DOES NOT test for the novel  Coronavirus (2019 nCoV)    Coronavirus HKU1 NOT DETECTED NOT DETECTED Final   Coronavirus NL63 NOT DETECTED NOT DETECTED Final   Coronavirus OC43 NOT DETECTED NOT DETECTED  Final   Metapneumovirus NOT DETECTED NOT DETECTED Final   Rhinovirus / Enterovirus NOT DETECTED NOT DETECTED Final   Influenza A NOT DETECTED NOT DETECTED Final   Influenza B NOT DETECTED NOT DETECTED Final   Parainfluenza Virus 1 NOT DETECTED NOT DETECTED Final   Parainfluenza Virus 2 NOT DETECTED NOT DETECTED Final   Parainfluenza Virus 3 NOT DETECTED NOT DETECTED Final   Parainfluenza Virus 4 NOT DETECTED NOT DETECTED Final   Respiratory Syncytial Virus NOT DETECTED NOT DETECTED Final   Bordetella pertussis NOT DETECTED NOT DETECTED Final   Bordetella Parapertussis NOT DETECTED NOT DETECTED Final   Chlamydophila pneumoniae NOT DETECTED NOT DETECTED Final   Mycoplasma pneumoniae NOT DETECTED NOT DETECTED Final    Comment: Performed at [facility], [address], [place], [place] [address]  Culture, blood (routine x 2)     Status: None (Preliminary result)   Collection Time: 01/01/24  5:50 AM   Specimen: BLOOD LEFT FOREARM  Result Value Ref Range Status   Specimen Description   Final    BLOOD LEFT FOREARM Performed at [facility], [address] [place], [place] [address]    Special Requests   Final    BOTTLES DRAWN AEROBIC AND ANAEROBIC Blood Culture adequate volume Performed at [facility], [address] [place], [place] [address]    Culture   Final    NO GROWTH 4 DAYS Performed at [facility], [address], [place], [place] [address]    Report Status PENDING  Incomplete  Culture, blood (routine x 2)     Status: None (Preliminary result)   Collection Time: 01/01/24  5:54 AM   Specimen: BLOOD LEFT HAND  Result Value Ref Range Status   Specimen Description   Final    BLOOD LEFT HAND Performed at [facility], [address], [place], [place] [address]    Special Requests   Final    BOTTLES DRAWN AEROBIC AND ANAEROBIC Blood Culture adequate volume Performed at [facility],  [address],  [place], [place] [address]    Culture   Final    NO GROWTH 4 DAYS Performed at [facility], [address], [place], [place] [address]    Report Status PENDING  Incomplete  MRSA Next Gen by PCR, Nasal     Status: None   Collection Time: 01/01/24  8:56 AM   Specimen: Nasal Swab  Result Value Ref Range Status   MRSA by PCR Next Gen NOT DETECTED NOT DETECTED Final    Comment: (NOTE) The GeneXpert MRSA Assay (FDA approved for NASAL specimens only), is one component of a comprehensive MRSA colonization surveillance program. It is not intended to diagnose MRSA infection nor to guide or monitor treatment for MRSA infections. Test performance is not FDA approved in patients less than 22 years old. Performed at [facility], [address] [place], [place] [address]     FURTHER DISCHARGE INSTRUCTIONS:   Get Medicines reviewed and adjusted: Please take all your medications with you for your next visit with your Primary MD   Laboratory/radiological data: Please request your Primary MD to go over all hospital tests and procedure/radiological results at the follow up, please ask your Primary MD to get all Hospital records sent to his/her office.   In some cases, they will be blood work, cultures and biopsy results pending at the time of your discharge. Please request that your primary care M.D. goes through all the records of your hospital data and follows up on these results.   Also Note the following: If you experience worsening of your admission symptoms, develop shortness of breath, life threatening emergency, suicidal or homicidal thoughts you must seek medical attention immediately by calling 911 or calling your MD immediately  if symptoms less severe.   You must read complete instructions/literature along with all the possible adverse reactions/side effects for all the Medicines you take and that have been prescribed to you. Take any new Medicines after you have  completely understood and accpet all the possible adverse reactions/side effects.    patient was instructed, not to drive, operate heavy machinery, perform activities at heights, swimming or participation in water  activities or provide baby-sitting services while on Pain, Sleep and Anxiety Medications; until their outpatient Physician has advised to do so again. Also recommended to not to take more than prescribed Pain, Sleep and Anxiety Medications.  It is not advisable to combine anxiety, sleep and pain medications without talking with your primary care provider.     Wear Seat belts while driving.   Please note: You were cared for by a hospitalist during your hospital stay. Once you are discharged, your primary care physician will handle any further medical issues. Please note that NO REFILLS for any discharge medications will be authorized once you are discharged, as it is imperative that you return to your primary care physician (or establish a relationship with a primary care physician if you do not have one) for your post hospital discharge needs so that they can reassess your need for medications and monitor your lab values  Time coordinating discharge: Over 30 minutes  SIGNED:   Fredia Skeeter, MD  [facility] 01/05/2024, 3:49 PM *Please note that this is a verbal dictation therefore any spelling or grammatical errors are due to the Dragon Medical One system interpretation. If 7PM-7AM, please contact night-coverage [email or web address]

## 2024-01-05 NOTE — Progress Notes (Addendum)
  Progress Note  Patient Name: Wanda Dorsey Date of Encounter: 01/05/2024 [facility] Cardiologist: Joelle VEAR Ren Donley, MD   Interval Summary   Felt dizzy yesterday due to orthostasis and diuresis was held. Denies any dizziness today but has not stood up. Denies any dyspnea.  Vital Signs Vitals:   01/04/24 1605 01/04/24 2014 01/05/24 0114 01/05/24 0547  BP: (P) 103/68 123/76 114/75 (!) 97/58  Pulse: 100 97 98 89  Resp: (P) 20 16 14 15   Temp: (P) 98.4 F (36.9 C) 97.8 F (36.6 C) 97.9 F (36.6 C) 98.1 F (36.7 C)  TempSrc: (P) Oral Oral Oral Oral  SpO2: 97% 96% 97% 98%  Weight:    57.2 kg  Height:        Intake/Output Summary (Last 24 hours) at 01/05/2024 9357 Last data filed at 01/04/2024 2111 Gross per 24 hour  Intake 3 ml  Output --  Net 3 ml      01/05/2024    5:47 AM 01/04/2024    3:39 AM 01/02/2024    5:00 AM  Last 3 Weights  Weight (lbs) 126 lb 1.7 oz 128 lb 8 oz 133 lb 6.1 oz  Weight (kg) 57.2 kg 58.287 kg 60.5 kg      Telemetry/ECG  NSR - Personally Reviewed  Physical Exam  GEN: No acute distress.   Neck: No JVD Cardiac: RRR, no murmurs, rubs, or gallops.  Respiratory: Clear to auscultation bilaterally. GI: Soft, nontender, non-distended  MS: No edema  Assessment & Plan  Ms Choi is a 13 yoM with Hx of alcohol abuse, HTN, OSA who is presenting with worsening dyspnea and orthopnea c/w CHF. TTE with EF 30-35% and WMAs. LHC on 11/19 w/ normal coronaries and LVEDP 27.    #New onset CHF (EF30-35% 11/25) #HTN #Alcohol abuse #Hx of rectal bleeding with CSY showing diverticulosis - Became orthostatic and hypotensive yesterday and diuretics and GDMT held - No complaints today; would perform orthostatics VS and make sure patient can ambulate - Can start PO lasix  20 mg daily today - Cont Empa 10; hold other GDMT given borderline BP - If BP tolerates above changes, patient can d/c this afternoon - We will sign off; feel free to call us   back if you have any questions   For questions or updates, please contact [facility] Please consult [email or web address] for contact info under   Signed, Joelle VEAR Ren Donley, MD

## 2024-01-05 NOTE — Progress Notes (Signed)
 SATURATION QUALIFICATIONS: (This note is used to comply with regulatory documentation for home oxygen)  Patient Saturations on Room Air at Rest = 96%  Patient Saturations on Room Air while Ambulating = 92%  Patient returned to room on RA 96%

## 2024-01-06 LAB — CULTURE, BLOOD (ROUTINE X 2)
Culture: NO GROWTH
Culture: NO GROWTH
Special Requests: ADEQUATE
Special Requests: ADEQUATE

## 2024-01-08 ENCOUNTER — Telehealth (HOSPITAL_COMMUNITY): Payer: Self-pay

## 2024-01-08 NOTE — Telephone Encounter (Signed)
 Called to confirm/remind patient of [facility] appointment [facility] on 01/09/24 11:00.   Appointment:   [] Confirmed  [x] Left mess   [] No answer/No voice mail  [] VM Full/unable to leave message  [] Phone not in service  Patient reminded to bring all medications and/or complete list.  Confirmed patient has transportation. Gave directions, instructed to utilize valet parking.

## 2024-01-08 NOTE — Progress Notes (Signed)
 HEART & VASCULAR TRANSITION OF CARE CONSULT NOTE    Referring Physician: Dr. Vernon PCP: Austin Mutton, MD   Chief Complaint: combined systolic and diastolic HF  HPI: Referred to clinic by Dr. Vernon for heart failure consultation.   Wanda Dorsey is a 78 y.o. female with HTN, COPD, chronic dyspnea, hx of breast cancer and previous GIB.   Admitted 11/25 with acute respiratory failure with hypoxia requiring ICU and bipap. W/u showed pulmonary edema and elevated BNP. Cardiology was consulted. Echo showed EF 30-35% and GIIDD. Tx from [facility] to [facility] for LHC. LHC showed normal coronaries. Required IV lasix  for diuresis. Weaned down to room air. GDMT was limited 2/2 orthostatic hypotension and dizziness.   Today she presents for AHF [facility] clinic visit. Overall feeling ***. Denies palpitations, CP, dizziness, edema, or PND/Orthopnea. *** SOB. Appetite ok. No fever or chills. Weight at home *** pounds. Taking all medications. Denies ETOH, tobacco or drug use. Family history ***.    Past Medical History:  Diagnosis Date   Cancer (HCC)    Hearing loss    Hypertension     Current Outpatient Medications  Medication Sig Dispense Refill   anastrozole  (ARIMIDEX ) 1 MG tablet Take 1 mg by mouth daily.      BREYNA 160-4.5 MCG/ACT inhaler Inhale 2 puffs into the lungs 2 (two) times daily.     cetirizine (ZYRTEC) 10 MG tablet Take 10 mg by mouth daily as needed for allergies. (Patient not taking: Reported on 01/01/2024)     cholecalciferol  (VITAMIN D ) 1000 UNITS tablet Take 5,000 Units by mouth daily.     empagliflozin  (JARDIANCE ) 10 MG TABS tablet Take 1 tablet (10 mg total) by mouth daily. 30 tablet 0   furosemide  (LASIX ) 20 MG tablet Take 1 tablet (20 mg total) by mouth daily. 30 tablet 0   ipratropium-albuterol  (DUONEB) 0.5-2.5 (3) MG/3ML SOLN Take 3 mLs by nebulization. Every 6-8 Hours PRN     levofloxacin  (LEVAQUIN ) 750 MG tablet Take 1 tablet (750 mg total) by mouth every other day for 4 doses. 4  tablet 0   LORazepam  (ATIVAN ) 0.5 MG tablet Take 0.5 mg by mouth 3 (three) times daily as needed.     Multiple Vitamin (MULTIVITAMIN WITH MINERALS) TABS tablet Take 1 tablet by mouth daily. (Patient not taking: Reported on 01/01/2024)     naproxen sodium (ALEVE) 220 MG tablet Take 220 mg by mouth 2 (two) times daily as needed (pain).     PROAIR  RESPICLICK 108 (90 Base) MCG/ACT AEPB Take 2 puffs by mouth 4 (four) times daily as needed (sob/wheezing).      thiamine  (VITAMIN B1) 100 MG tablet Take 200 mg by mouth daily.     No current facility-administered medications for this visit.    Allergies  Allergen Reactions   Other Itching    Tree nuts- itching Hot peppers- jalapeno, etc..- get rash around mouth    Penicillins Rash and Other (See Comments)    Reaction: unknown  Has patient had a PCN reaction causing immediate rash, facial/tongue/throat swelling, SOB or lightheadedness with hypotension: unknown Has patient had a PCN reaction causing severe rash involving mucus membranes or skin necrosis: unknown Has patient had a PCN reaction that required hospitalization: no Has patient had a PCN reaction occurring within the last 10 years: no If all of the above answers are NO, then may proceed with Cephalosporin use.       Social History   Socioeconomic History   Marital  status: Married    Spouse name: Dempsey   Number of children: 2   Years of education: Not on file   Highest education level: Master's degree (e.g., MA, MS, MEng, MEd, MSW, MBA)  Occupational History   Occupation: retired  Tobacco Use   Smoking status: Former   Smokeless tobacco: Never  Advertising Account Planner   Vaping status: Never Used  Substance and Sexual Activity   Alcohol use: Yes    Comment: 80 proff vodka approx 20 oz daily    Drug use: No   Sexual activity: Not on file  Other Topics Concern   Not on file  Social History Narrative   Not on file   Social Drivers of Health   Financial Resource Strain: Low Risk   (01/03/2024)   Overall Financial Resource Strain (CARDIA)    Difficulty of Paying Living Expenses: Not hard at all  Food Insecurity: No Food Insecurity (01/01/2024)   Hunger Vital Sign    Worried About Running Out of Food in the Last Year: Never true    Ran Out of Food in the Last Year: Never true  Transportation Needs: No Transportation Needs (01/01/2024)   PRAPARE - Administrator, Civil Service (Medical): No    Lack of Transportation (Non-Medical): No  Physical Activity: Not on file  Stress: Not on file  Social Connections: Socially Integrated (01/01/2024)   Social Connection and Isolation Panel    Frequency of Communication with Friends and Family: More than three times a week    Frequency of Social Gatherings with Friends and Family: Three times a week    Attends Religious Services: More than 4 times per year    Active Member of Clubs or Organizations: No    Attends Banker Meetings: More than 4 times per year    Marital Status: Married  Catering Manager Violence: Not At Risk (01/01/2024)   Humiliation, Afraid, Rape, and Kick questionnaire    Fear of Current or Ex-Partner: No    Emotionally Abused: No    Physically Abused: No    Sexually Abused: No     No family history on file.  There were no vitals filed for this visit.  PHYSICAL EXAM: General:  *** appearing.  No respiratory difficulty Neck: JVD *** cm.  Cor: Regular rate & rhythm. No murmurs. Lungs: clear Extremities: no edema  Neuro: alert & oriented x 3. Affect pleasant.   ECG:   ASSESSMENT & PLAN:  Combined systolic and diastolic HF - LVEF 30-35%, LV with GHK, GIIDD, RV normal, mod pleural effusion, trivial MR - LHC with no CAD - unknown etiology. *** chemo induced? *** - NYHA *** - Volume ***.  - Continue Jardiance  10 mg daily - Continue lasix  20 mg daily - GDMT limited by orthostasis - Echo after ~3 months on good GDMT - Consider cMRI to rule out viral/infiltrative  etiology.   Orthostatic hypotension - At discharge during last admission - BP *** today  Weakness - working with home PT  COPD  - Followed by ***  Hx breast cancer *** - ER +, lymph node (-) - s/p R lumpectomy 99', s/p R mastectomy 10' for local reoccurrence.  - s/p Cytoxan and Adriamycin + radiation  - Arimidex  toxicity? *** how long *** 5-10 years?  - Followed by? ***   Referred to HFSW (PCP, Medications, Transportation, ETOH Abuse, Drug Abuse, Insurance, Financial ): Yes or No Refer to Pharmacy: Yes or No Refer to Home Health: Yes on  No Refer to Advanced Heart Failure Clinic: Yes or no  Refer to General Cardiology: Yes or No  Follow up

## 2024-01-09 ENCOUNTER — Encounter (HOSPITAL_COMMUNITY): Payer: Self-pay

## 2024-01-09 ENCOUNTER — Ambulatory Visit (HOSPITAL_COMMUNITY)
Admit: 2024-01-09 | Discharge: 2024-01-09 | Disposition: A | Discharge status: Home / Self Care | Attending: Internal Medicine | Admitting: Internal Medicine

## 2024-01-09 ENCOUNTER — Other Ambulatory Visit (HOSPITAL_COMMUNITY): Payer: Self-pay

## 2024-01-09 VITALS — BP 122/84 | HR 102 | Ht 62.0 in | Wt 133.0 lb

## 2024-01-09 DIAGNOSIS — R531 Weakness: Secondary | ICD-10-CM | POA: Diagnosis not present

## 2024-01-09 DIAGNOSIS — Z853 Personal history of malignant neoplasm of breast: Secondary | ICD-10-CM | POA: Insufficient documentation

## 2024-01-09 DIAGNOSIS — Z7984 Long term (current) use of oral hypoglycemic drugs: Secondary | ICD-10-CM | POA: Insufficient documentation

## 2024-01-09 DIAGNOSIS — R0609 Other forms of dyspnea: Secondary | ICD-10-CM | POA: Insufficient documentation

## 2024-01-09 DIAGNOSIS — J449 Chronic obstructive pulmonary disease, unspecified: Secondary | ICD-10-CM | POA: Diagnosis not present

## 2024-01-09 DIAGNOSIS — Z79811 Long term (current) use of aromatase inhibitors: Secondary | ICD-10-CM | POA: Diagnosis not present

## 2024-01-09 DIAGNOSIS — I110 Hypertensive heart disease with heart failure: Secondary | ICD-10-CM | POA: Insufficient documentation

## 2024-01-09 DIAGNOSIS — R5383 Other fatigue: Secondary | ICD-10-CM | POA: Insufficient documentation

## 2024-01-09 DIAGNOSIS — Z9011 Acquired absence of right breast and nipple: Secondary | ICD-10-CM | POA: Insufficient documentation

## 2024-01-09 DIAGNOSIS — D649 Anemia, unspecified: Secondary | ICD-10-CM | POA: Insufficient documentation

## 2024-01-09 DIAGNOSIS — I5042 Chronic combined systolic (congestive) and diastolic (congestive) heart failure: Secondary | ICD-10-CM | POA: Diagnosis not present

## 2024-01-09 DIAGNOSIS — I951 Orthostatic hypotension: Secondary | ICD-10-CM | POA: Diagnosis not present

## 2024-01-09 MED ORDER — SPIRONOLACTONE 25 MG PO TABS
12.5000 mg | ORAL_TABLET | Freq: Every day | ORAL | 3 refills | Status: DC
  Filled 2024-01-09: qty 45, 90d supply, fill #0

## 2024-01-09 MED ORDER — FUROSEMIDE 20 MG PO TABS
20.0000 mg | ORAL_TABLET | ORAL | Status: AC | PRN
Start: 2024-01-09 — End: ?

## 2024-01-09 NOTE — Progress Notes (Signed)
 ReDS Vest / Clip - 01/09/24 1137       ReDS Vest / Clip   Station Marker A    Ruler Value 28    ReDS Value Range Low volume    ReDS Actual Value 26

## 2024-01-09 NOTE — Patient Instructions (Addendum)
 Good to see you today!   CHANGE Lasix   to as needed  START Spironolactone  12.5 mg (1/2 tablet) daily  Lab work  as scheduled  Your physician has requested that you have a cardiac MRI. Cardiac MRI uses a computer to create images of your heart as its beating, producing both still and moving pictures of your heart and major blood vessels. For further information please visit [email or web address]. Please follow the instruction sheet given to you today for more information.  Your physician recommends that you schedule a follow-up appointment  as scheduled  If you have any questions or concerns before your next appointment please send us  a message through [place] or call our office at [phone].    TO LEAVE A MESSAGE FOR THE NURSE SELECT OPTION 2, PLEASE LEAVE A MESSAGE INCLUDING: YOUR NAME DATE OF BIRTH CALL BACK NUMBER REASON FOR CALL**this is important as we prioritize the call backs  YOU WILL RECEIVE A CALL BACK THE SAME DAY AS LONG AS YOU CALL BEFORE 4:00 PM At the Advanced [facility] Failure Clinic, you and your health needs are our priority. As part of our continuing mission to provide you with exceptional heart care, we have created designated Provider Care Teams. These Care Teams include your primary Cardiologist (physician) and Advanced Practice Providers (APPs- Physician Assistants and Nurse Practitioners) who all work together to provide you with the care you need, when you need it.   You may see any of the following providers on your designated Care Team at your next follow up: Dr Toribio Fuel Dr Ezra Shuck Dr. Morene Brownie Greig Mosses, NP Caffie Shed, [place] [facility] [place], [place] Beckey Coe, NP Jordan Lee, NP Ellouise Class, NP Tinnie Redman, PharmD Jaun Bash, PharmD   Please be sure to bring in all your medications bottles to every appointment.    Thank you for choosing [facility]

## 2024-01-19 ENCOUNTER — Ambulatory Visit (HOSPITAL_COMMUNITY)
Admission: RE | Admit: 2024-01-19 | Discharge: 2024-01-19 | Disposition: A | Source: Ambulatory Visit | Attending: Cardiology

## 2024-01-19 ENCOUNTER — Ambulatory Visit (HOSPITAL_COMMUNITY): Payer: Self-pay | Admitting: Internal Medicine

## 2024-01-19 DIAGNOSIS — I5042 Chronic combined systolic (congestive) and diastolic (congestive) heart failure: Secondary | ICD-10-CM

## 2024-01-19 LAB — BASIC METABOLIC PANEL WITH GFR
Anion gap: 6 (ref 5–15)
BUN: 14 mg/dL (ref 8–23)
CO2: 30 mmol/L (ref 22–32)
Calcium: 9.2 mg/dL (ref 8.9–10.3)
Chloride: 105 mmol/L (ref 98–111)
Creatinine, Ser: 0.73 mg/dL (ref 0.44–1.00)
GFR, Estimated: >60 mL/min (ref >60)
Glucose, Bld: 107 mg/dL — ABNORMAL HIGH (ref 70–99)
Potassium: 4.3 mmol/L (ref 3.5–5.1)
Sodium: 141 mmol/L (ref 135–145)

## 2024-01-29 NOTE — Progress Notes (Signed)
 Advanced Heart Failure Consult Note   PCP: Austin Mutton, MD  Primary Cardiologist: Dr. Deneise HF Cardiologist: Dr Zenaida  HPI: Wanda Dorsey is a 78 y.o. female with HTN, COPD, chronic dyspnea, hx of breast cancer , previous GIB and newly diagnosed systolic heart failure.   Admitted 11/25 with acute respiratory failure with hypoxia, requiring ICU and bipap. W/u showed pulmonary edema and elevated BNP. Cardiology was consulted. Echo showed EF 30-35% and GIIDD. Tx from [facility] to [facility] for LHC. LHC showed normal coronaries. Required IV lasix  for diuresis. Weaned down to room air. GDMT was limited 2/2 orthostatic hypotension and dizziness.   Today she returns for HF follow up, referred from [facility], with her son (who is an CHARITY FUNDRAISER). Overall feeling fine. Breathing is the best it has been in 6 years! No undue dyspnea with laundry or housework. She has some fatigue, working with PT 2x/week. She has occasional positional dizziness in the AM, no falls. Denies palpitations, abnormal bleeding, CP, edema, or PND/Orthopnea. Appetite ok. Weight at home 129-132 pounds. Taking all medications, has not needed Lasix . Has a yeast infection, treating with OTC monistat. Retired clinical research associate, son his CHARITY FUNDRAISER at [employer]. No ETOH, tobacco or drug use. Previously drank 1/2-3/4 bottle vodka daily x years.   Past Medical History:  Diagnosis Date   Cancer (HCC)    Hearing loss    Hypertension    Current Outpatient Medications  Medication Sig Dispense Refill   anastrozole  (ARIMIDEX ) 1 MG tablet Take 1 mg by mouth daily.      BREYNA 160-4.5 MCG/ACT inhaler Inhale 2 puffs into the lungs 2 (two) times daily.     cetirizine (ZYRTEC) 10 MG tablet Take 10 mg by mouth daily as needed for allergies.     cholecalciferol  (VITAMIN D ) 1000 UNITS tablet Take 5,000 Units by mouth daily.     empagliflozin  (JARDIANCE ) 10 MG TABS tablet Take 1 tablet (10 mg total) by mouth daily. 30 tablet 0   furosemide  (LASIX ) 20 MG tablet Take 1 tablet  (20 mg total) by mouth as needed.     LORazepam  (ATIVAN ) 0.5 MG tablet Take 0.5 mg by mouth 3 (three) times daily as needed.     naproxen sodium (ALEVE) 220 MG tablet Take 220 mg by mouth 2 (two) times daily as needed (pain).     PROAIR  RESPICLICK 108 (90 Base) MCG/ACT AEPB Take 2 puffs by mouth 4 (four) times daily as needed (sob/wheezing).      spironolactone  (ALDACTONE ) 25 MG tablet Take 1/2 tablets (12.5 mg total) by mouth daily. 90 tablet 3   thiamine  (VITAMIN B1) 100 MG tablet Take 200 mg by mouth daily.     No current facility-administered medications for this encounter.   Allergies  Allergen Reactions   Other Itching    Tree nuts- itching Hot peppers- jalapeno, etc..- get rash around mouth    Penicillins Rash and Other (See Comments)    Reaction: unknown  Has patient had a PCN reaction causing immediate rash, facial/tongue/throat swelling, SOB or lightheadedness with hypotension: unknown Has patient had a PCN reaction causing severe rash involving mucus membranes or skin necrosis: unknown Has patient had a PCN reaction that required hospitalization: no Has patient had a PCN reaction occurring within the last 10 years: no If all of the above answers are NO, then may proceed with Cephalosporin use.    Social History   Socioeconomic History   Marital status: Married    Spouse name: Dempsey  Number of children: 2   Years of education: Not on file   Highest education level: Master's degree (e.g., MA, MS, MEng, MEd, MSW, MBA)  Occupational History   Occupation: retired  Tobacco Use   Smoking status: Former   Smokeless tobacco: Never  Advertising Account Planner   Vaping status: Never Used  Substance and Sexual Activity   Alcohol use: Yes    Comment: 80 proff vodka approx 20 oz daily    Drug use: No   Sexual activity: Not on file  Other Topics Concern   Not on file  Social History Narrative   Not on file   Social Drivers of Health   Tobacco Use: Medium Risk (01/09/2024)   Patient  History    Smoking Tobacco Use: Former    Smokeless Tobacco Use: Never    Passive Exposure: Not on Actuary Strain: Low Risk (01/03/2024)   Overall Financial Resource Strain (CARDIA)    Difficulty of Paying Living Expenses: Not hard at all  Food Insecurity: No Food Insecurity (01/01/2024)   Epic    Worried About Programme Researcher, Broadcasting/film/video in the Last Year: Never true    Ran Out of Food in the Last Year: Never true  Transportation Needs: No Transportation Needs (01/01/2024)   Epic    Lack of Transportation (Medical): No    Lack of Transportation (Non-Medical): No  Physical Activity: Not on file  Stress: Not on file  Social Connections: Socially Integrated (01/01/2024)   Social Connection and Isolation Panel    Frequency of Communication with Friends and Family: More than three times a week    Frequency of Social Gatherings with Friends and Family: Three times a week    Attends Religious Services: More than 4 times per year    Active Member of Clubs or Organizations: No    Attends Banker Meetings: More than 4 times per year    Marital Status: Married  Catering Manager Violence: Not At Risk (01/01/2024)   Epic    Fear of Current or Ex-Partner: No    Emotionally Abused: No    Physically Abused: No    Sexually Abused: No  Depression (PHQ2-9): Not on file  Alcohol Screen: Low Risk (01/03/2024)   Alcohol Screen    Last Alcohol Screening Score (AUDIT): 0  Housing: Low Risk (01/01/2024)   Epic    Unable to Pay for Housing in the Last Year: No    Number of Times Moved in the Last Year: 0    Homeless in the Last Year: No  Utilities: Not At Risk (01/01/2024)   Epic    Threatened with loss of utilities: No  Health Literacy: Not on file    No family history on file.  Wt Readings from Last 3 Encounters:  01/30/24 61.7 kg (136 lb)  01/09/24 60.3 kg (133 lb)  01/05/24 57.2 kg (126 lb 1.7 oz)   BP 122/78   Pulse 92   Wt 61.7 kg (136 lb)   SpO2 94%   BMI  24.87 kg/m   PHYSICAL EXAM: General:  NAD. No resp difficulty, walked into clinic HEENT: Normal Neck: Supple. No JVD. Cor: Regular rate & rhythm. No rubs, gallops or murmurs. Lungs: Clear Abdomen: Soft, nontender, nondistended.  Extremities: No cyanosis, clubbing, rash, edema Neuro: Alert & oriented x 3, moves all 4 extremities w/o difficulty. Affect pleasant.  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 No prior echos done.  - cMRI ordered to rule out viral/infiltrative etiology, will see if we can schedule this soon - Improved NYHA I-II. Volume OK, ReDs 30% - Start losartan  12.5 mg daily, doubt she'll tolerate Entresto yet  - Continue Lasix  20 mg PRN - Continue Jardiance  10 mg daily. Will need to stop if yeast infection does not resolve, or recurs - Continue spironolactone  12.5 mg daily. - Add beta blocker vs ivabradine next. - Plan to repeat echo after 3 months on GDMT. - Recent labs reviewed, check BMET in 10-14 days - Discuss CR, once she has graduated from PT  Orthostatic hypotension - BP stable today - Resolved  COPD  - Follows with pulmonology  Hx breast cancer  - ER +, lymph node (-) - s/p R lumpectomy 99', s/p R mastectomy 10' for local reoccurrence.  - s/p Cytoxan and Adriamycin (99') + radiation  - Still on Arimidex  per her request, refuses to stop.   Follow up in 3 weeks with APP (add beta blocker) and 3 months with Dr. Zenaida + echo. If EF recovers on echo, consider graduation from AHF clinic and continue follow up with general cardiology.  Harlene Gainer, FNP-BC 01/30/2024

## 2024-01-29 NOTE — Progress Notes (Unsigned)
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  Wanda Dorsey is a 78 year old female with cardiomyopathy who presents for follow-up after hospital discharge. Since discharge she has had no dizziness. Initial difficulty with walking and rising from a chair has improved with physical therapy, and she can now walk around the house, do household chores, and climb stairs independently. Her blood pressure medication was stopped at discharge, and she has managed without it. Her weight has been stable at 129 to 130 pounds.  She has not taken Lasix  because the prescription was not filled and she has not felt she needed it. She was seen in HF clinic and started on losartan  yesterday.  ROS: Please see the history of present illness. All other systems are reviewed and negative.   PHYSICAL EXAM: VS:  BP 138/89   Pulse 85   Ht 5' 2 (1.575 m)   Wt 136 lb 3.2 oz (61.8 kg)   SpO2 97%   BMI 24.91 kg/m  , BMI Body mass index is 24.91 kg/m. GEN: Well nourished, well developed, in no acute distress HEENT: normal Neck: no JVD, carotid bruits, or masses Cardiac: RRR; no murmurs, rubs, or gallops,no edema  Respiratory:  CTAB bilaterally, normal work of breathing GI: soft, nontender, nondistended, + BS Extremities: No LE edema Skin: warm and dry, no rash Neuro:  Strength and sensation are intact  Recent Labs: Reviewed  Studies:  Reviewed  ASSESSMENT AND PLAN: LASHAWNTA Dorsey is a 78 y.o. female who presents for follow up.    Chronic combined systolic and diastolic heart failure Recent hospitalization. No dizziness or leg swelling. Weight stable. On spironolactone  and empagliflozin . Losartan  added in CHF clinic yesterday.  - Cont GDMT per CHF clinic - Refilled Lasix  prescription for emergency use. - Schedule heart MRI on January 2nd. - Coordinate care with heart failure team.  Orthostatic hypotension Previous dizziness likely due to dehydration and medication adjustments. Currently well-managed. - Ensure adequate hydration.  Alcohol dependence, in remission Alcohol dependence in remission. Strict instructions to avoid alcohol due to heart condition. Certified addiction nurse at home for support. - Continue abstinence from alcohol. - Maintain support from certified addiction nurse.    Signed, Joelle VEAR Ren Donley, MD  01/31/2024 9:31 AM    [facility]

## 2024-01-30 ENCOUNTER — Telehealth (HOSPITAL_COMMUNITY): Payer: Self-pay

## 2024-01-30 ENCOUNTER — Ambulatory Visit (HOSPITAL_COMMUNITY)
Admission: RE | Admit: 2024-01-30 | Discharge: 2024-01-30 | Disposition: A | Source: Ambulatory Visit | Attending: Cardiology | Admitting: Cardiology

## 2024-01-30 ENCOUNTER — Encounter (HOSPITAL_COMMUNITY): Payer: Self-pay

## 2024-01-30 ENCOUNTER — Other Ambulatory Visit (HOSPITAL_COMMUNITY): Payer: Self-pay

## 2024-01-30 VITALS — BP 122/78 | HR 92 | Wt 136.0 lb

## 2024-01-30 DIAGNOSIS — Z923 Personal history of irradiation: Secondary | ICD-10-CM | POA: Diagnosis not present

## 2024-01-30 DIAGNOSIS — F1011 Alcohol abuse, in remission: Secondary | ICD-10-CM | POA: Diagnosis not present

## 2024-01-30 DIAGNOSIS — I5022 Chronic systolic (congestive) heart failure: Secondary | ICD-10-CM

## 2024-01-30 DIAGNOSIS — Z8719 Personal history of other diseases of the digestive system: Secondary | ICD-10-CM | POA: Diagnosis not present

## 2024-01-30 DIAGNOSIS — Z79811 Long term (current) use of aromatase inhibitors: Secondary | ICD-10-CM | POA: Insufficient documentation

## 2024-01-30 DIAGNOSIS — Z87891 Personal history of nicotine dependence: Secondary | ICD-10-CM | POA: Diagnosis not present

## 2024-01-30 DIAGNOSIS — B379 Candidiasis, unspecified: Secondary | ICD-10-CM | POA: Insufficient documentation

## 2024-01-30 DIAGNOSIS — I110 Hypertensive heart disease with heart failure: Secondary | ICD-10-CM | POA: Diagnosis not present

## 2024-01-30 DIAGNOSIS — Z853 Personal history of malignant neoplasm of breast: Secondary | ICD-10-CM

## 2024-01-30 DIAGNOSIS — J449 Chronic obstructive pulmonary disease, unspecified: Secondary | ICD-10-CM

## 2024-01-30 DIAGNOSIS — Z9011 Acquired absence of right breast and nipple: Secondary | ICD-10-CM | POA: Insufficient documentation

## 2024-01-30 DIAGNOSIS — Z79899 Other long term (current) drug therapy: Secondary | ICD-10-CM | POA: Insufficient documentation

## 2024-01-30 DIAGNOSIS — I951 Orthostatic hypotension: Secondary | ICD-10-CM | POA: Diagnosis not present

## 2024-01-30 DIAGNOSIS — I5040 Unspecified combined systolic (congestive) and diastolic (congestive) heart failure: Secondary | ICD-10-CM | POA: Insufficient documentation

## 2024-01-30 DIAGNOSIS — Z880 Allergy status to penicillin: Secondary | ICD-10-CM | POA: Diagnosis not present

## 2024-01-30 DIAGNOSIS — Z9221 Personal history of antineoplastic chemotherapy: Secondary | ICD-10-CM | POA: Insufficient documentation

## 2024-01-30 MED ORDER — LOSARTAN POTASSIUM 25 MG PO TABS: 12.5000 mg | ORAL_TABLET | Freq: Every day | ORAL | 3 refills | Status: DC

## 2024-01-30 MED ORDER — LOSARTAN POTASSIUM 25 MG PO TABS
12.5000 mg | ORAL_TABLET | Freq: Every day | ORAL | 3 refills | Status: DC
  Filled 2024-01-30: qty 45, 90d supply, fill #0

## 2024-01-30 NOTE — Progress Notes (Signed)
 ReDS Vest / Clip - 01/30/24 1536       ReDS Vest / Clip   Station Marker A    Ruler Value 25.5    ReDS Value Range Low volume    ReDS Actual Value 30

## 2024-01-30 NOTE — Patient Instructions (Addendum)
 Good to see you today!  START losartan  12.5 mg (1/2 tablet) daily  Repeat lab work as scheduled  Your physician has requested that you have an echocardiogram. Echocardiography is a painless test that uses sound waves to create images of your heart. It provides your doctor with information about the size and shape of your heart and how well your hearts chambers and valves are working. This procedure takes approximately one hour. There are no restrictions for this procedure. Please do NOT wear cologne, perfume, aftershave, or lotions (deodorant is allowed). Please arrive 15 minutes prior to your appointment time.  Please note: We ask at that you not bring children with you during ultrasound (echo/ vascular) testing. Due to room size and safety concerns, children are not allowed in the ultrasound rooms during exams. Our front office staff cannot provide observation of children in our lobby area while testing is being conducted. An adult accompanying a patient to their appointment will only be allowed in the ultrasound room at the discretion of the ultrasound technician under special circumstances. We apologize for any inconvenience.  Your physician recommends that you schedule a follow-up appointment as scheduled and then 3 months with echocardiogram (March)  Call  office in January to schedule an appointment   Cardiac MRI has been scheduled for January 2 ,arrive @ 7:30 am at the main entrance to hospital Entrance A If you have any questions or concerns before your next appointment please send us  a message through [place] or call our office at [phone].    TO LEAVE A MESSAGE FOR THE NURSE SELECT OPTION 2, PLEASE LEAVE A MESSAGE INCLUDING: YOUR NAME DATE OF BIRTH CALL BACK NUMBER REASON FOR CALL**this is important as we prioritize the call backs  YOU WILL RECEIVE A CALL BACK THE SAME DAY AS LONG AS YOU CALL BEFORE 4:00 PM At the [facility], you and your health needs are  our priority. As part of our continuing mission to provide you with exceptional heart care, we have created designated Provider Care Teams. These Care Teams include your primary Cardiologist (physician) and Advanced Practice Providers (APPs- Physician Assistants and Nurse Practitioners) who all work together to provide you with the care you need, when you need it.   You may see any of the following providers on your designated Care Team at your next follow up: Dr Toribio Fuel Dr Ezra Shuck Dr. Morene Brownie Greig Mosses, NP Caffie Shed, [place] [facility] [place], [place] Beckey Coe, NP Jordan Lee, NP Ellouise Class, NP Tinnie Redman, PharmD Jaun Bash, PharmD   Please be sure to bring in all your medications bottles to every appointment.    Thank you for choosing [facility]

## 2024-01-30 NOTE — Telephone Encounter (Signed)
 Called to confirm/remind patient of their appointment at the Advanced Heart Failure Clinic on 01/30/24.   Appointment:   [] Confirmed  [x] Left mess   [] No answer/No voice mail  [] VM Full/unable to leave message  [] Phone not in service  And to bring in all medications and/or complete list.

## 2024-01-31 ENCOUNTER — Ambulatory Visit

## 2024-01-31 ENCOUNTER — Other Ambulatory Visit (HOSPITAL_COMMUNITY): Payer: Self-pay

## 2024-01-31 VITALS — BP 138/89 | HR 85 | Ht 62.0 in | Wt 136.2 lb

## 2024-01-31 DIAGNOSIS — I5042 Chronic combined systolic (congestive) and diastolic (congestive) heart failure: Secondary | ICD-10-CM | POA: Diagnosis present

## 2024-01-31 DIAGNOSIS — I951 Orthostatic hypotension: Secondary | ICD-10-CM | POA: Insufficient documentation

## 2024-01-31 DIAGNOSIS — I10 Essential (primary) hypertension: Secondary | ICD-10-CM | POA: Diagnosis present

## 2024-01-31 MED ORDER — FUROSEMIDE 20 MG PO TABS
20.0000 mg | ORAL_TABLET | ORAL | 1 refills | Status: AC | PRN
  Filled 2024-01-31 (×2): qty 30, 30d supply, fill #0

## 2024-01-31 NOTE — Patient Instructions (Signed)
 Medication Instructions:  Your physician recommends that you continue on your current medications as directed. Please refer to the Current Medication list given to you today.  *If you need a refill on your cardiac medications before your next appointment, please call your pharmacy*  Lab Work: None ordered.  If you have labs (blood work) drawn today and your tests are completely normal, you will receive your results only by: MyChart Message (if you have MyChart) OR A paper copy in the mail If you have any lab test that is abnormal or we need to change your treatment, we will call you to review the results.  Testing/Procedures: None ordered.   Follow-Up: At [facility], you and your health needs are our priority.  As part of our continuing mission to provide you with exceptional heart care, our providers are all part of one team.  This team includes your primary Cardiologist (physician) and Advanced Practice Providers or APPs (Physician Assistants and Nurse Practitioners) who all work together to provide you with the care you need, when you need it.  Your next appointment:   Follow up with Dr Azobou as needed.  We recommend signing up for the patient portal called MyChart.  Sign up information is provided on this After Visit Summary.  MyChart is used to connect with patients for Virtual Visits (Telemedicine).  Patients are able to view lab/test results, encounter notes, upcoming appointments, etc.  Non-urgent messages can be sent to your provider as well.   To learn more about what you can do with MyChart, go to [email or web address].   Other Instructions Continue follow up with [facility]

## 2024-02-05 ENCOUNTER — Other Ambulatory Visit (HOSPITAL_COMMUNITY): Payer: Self-pay

## 2024-02-05 MED ORDER — EMPAGLIFLOZIN 10 MG PO TABS
10.0000 mg | ORAL_TABLET | Freq: Every day | ORAL | 3 refills | Status: DC
  Filled 2024-02-05: qty 90, 90d supply, fill #0

## 2024-02-07 ENCOUNTER — Other Ambulatory Visit (HOSPITAL_COMMUNITY): Payer: Self-pay

## 2024-02-09 ENCOUNTER — Ambulatory Visit (HOSPITAL_COMMUNITY): Payer: Self-pay | Admitting: Cardiology

## 2024-02-09 ENCOUNTER — Ambulatory Visit (HOSPITAL_COMMUNITY)
Admission: RE | Admit: 2024-02-09 | Discharge: 2024-02-09 | Disposition: A | Discharge status: Home / Self Care | Source: Ambulatory Visit | Attending: Internal Medicine | Admitting: Internal Medicine

## 2024-02-09 ENCOUNTER — Telehealth (HOSPITAL_COMMUNITY): Payer: Self-pay | Admitting: Cardiology

## 2024-02-09 DIAGNOSIS — I5022 Chronic systolic (congestive) heart failure: Secondary | ICD-10-CM | POA: Diagnosis present

## 2024-02-09 LAB — BASIC METABOLIC PANEL WITH GFR
Anion gap: 10 (ref 5–15)
BUN: 16 mg/dL (ref 8–23)
CO2: 24 mmol/L (ref 22–32)
Calcium: 9.3 mg/dL (ref 8.9–10.3)
Chloride: 105 mmol/L (ref 98–111)
Creatinine, Ser: 0.64 mg/dL (ref 0.44–1.00)
GFR, Estimated: >60 mL/min
Glucose, Bld: 104 mg/dL — ABNORMAL HIGH (ref 70–99)
Potassium: 4.3 mmol/L (ref 3.5–5.1)
Sodium: 139 mmol/L (ref 135–145)

## 2024-02-09 NOTE — Telephone Encounter (Signed)
 Patient called to request RX for persistent yeast infections. Has not responded to 2 courses of OTC monistat 3.  Reports yeast is on the outside of vagina and very uncomfortable ?thrush   Please advise

## 2024-02-09 NOTE — Telephone Encounter (Signed)
 Please call. Stop jardiance .   Reassess next week at her follow up.   Sharin Altidor NP-C  2:38 PM

## 2024-02-13 ENCOUNTER — Encounter (HOSPITAL_COMMUNITY): Payer: Self-pay

## 2024-02-14 NOTE — Telephone Encounter (Signed)
 Pt aware.

## 2024-02-14 NOTE — Progress Notes (Signed)
 "   Advanced Heart Failure Clinic Note   PCP: Pcp, No  Primary Cardiologist: Dr. Deneise  HF Cardiologist: Dr Zenaida  HPI: Wanda Dorsey is a 78 y.o. female with HTN, COPD, chronic dyspnea, hx of breast cancer , previous GIB and recently diagnosed systolic heart failure.   Admitted 11/25 with acute respiratory failure with hypoxia, requiring ICU and bipap. W/u showed pulmonary edema and elevated BNP. Cardiology was consulted. Echo showed EF 30-35% and GIIDD. Tx from [facility] to [facility] for LHC. LHC showed normal coronaries. Required IV lasix  for diuresis. Weaned down to room air. GDMT was limited 2/2 orthostatic hypotension and dizziness.   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   Today she returns for AHF follow up with her son. Overall feeling very well. Denies palpitations, CP, dizziness, edema, or PND/Orthopnea. No SOB. Appetite ok. No fever or chills. Weight at home 128-129 pounds. Taking all medications. Denies ETOH, tobacco or drug use. Constantly busy in her house. Still working with PT, using a weighted vest around the house while doing ADLs.    Past Medical History:  Diagnosis Date   Cancer (HCC)    Hearing loss    Hypertension    Current Outpatient Medications  Medication Sig Dispense Refill   anastrozole  (ARIMIDEX ) 1 MG tablet Take 1 mg by mouth daily.      BREYNA 160-4.5 MCG/ACT inhaler Inhale 2 puffs into the lungs 2 (two) times daily.     cetirizine (ZYRTEC) 10 MG tablet Take 10 mg by mouth daily as needed for allergies.     cholecalciferol  (VITAMIN D ) 1000 UNITS tablet Take 5,000 Units by mouth daily.     furosemide  (LASIX ) 20 MG tablet Take 1 tablet (20 mg total) by mouth as needed. 30 tablet 1   LORazepam  (ATIVAN ) 0.5 MG tablet Take 0.5 mg by mouth 3 (three) times daily as needed.     losartan  (COZAAR ) 25 MG  tablet Take 1/2 tablet (12.5 mg total) by mouth daily. 90 tablet 3   naproxen sodium (ALEVE) 220 MG tablet Take 220 mg by mouth 2 (two) times daily as needed (pain).     PROAIR  RESPICLICK 108 (90 Base) MCG/ACT AEPB Take 2 puffs by mouth 4 (four) times daily as needed (sob/wheezing).      spironolactone  (ALDACTONE ) 25 MG tablet Take 1/2 tablets (12.5 mg total) by mouth daily. 90 tablet 3   thiamine  (VITAMIN B1) 100 MG tablet Take 200 mg by mouth daily.     empagliflozin  (JARDIANCE ) 10 MG TABS tablet Take 1 tablet (10 mg total) by mouth daily. 90 tablet 3   No current facility-administered medications for this encounter.   Allergies  Allergen Reactions   Other Itching    Tree nuts- itching Hot peppers- jalapeno, etc..- get rash around mouth    Penicillins Rash and Other (See Comments)    Reaction: unknown  Has patient had a PCN reaction causing immediate rash, facial/tongue/throat swelling, SOB or lightheadedness with hypotension: unknown Has patient had a PCN reaction causing severe rash involving mucus membranes or skin necrosis: unknown Has patient had a PCN reaction that required hospitalization: no Has patient had a PCN reaction occurring within the last 10 years: no If all of the above answers are NO, then may proceed with Cephalosporin use.    Social History   Socioeconomic History   Marital  status: Married    Spouse name: Dempsey   Number of children: 2   Years of education: Not on file   Highest education level: Master's degree (e.g., MA, MS, MEng, MEd, MSW, MBA)  Occupational History   Occupation: retired  Tobacco Use   Smoking status: Former   Smokeless tobacco: Never  Advertising Account Planner   Vaping status: Never Used  Substance and Sexual Activity   Alcohol use: Yes    Comment: 80 proff vodka approx 20 oz daily    Drug use: No   Sexual activity: Not on file  Other Topics Concern   Not on file  Social History Narrative   Not on file   Social Drivers of Health   Tobacco  Use: Medium Risk (01/31/2024)   Patient History    Smoking Tobacco Use: Former    Smokeless Tobacco Use: Never    Passive Exposure: Not on Actuary Strain: Low Risk (01/03/2024)   Overall Financial Resource Strain (CARDIA)    Difficulty of Paying Living Expenses: Not hard at all  Food Insecurity: No Food Insecurity (01/01/2024)   Epic    Worried About Programme Researcher, Broadcasting/film/video in the Last Year: Never true    Ran Out of Food in the Last Year: Never true  Transportation Needs: No Transportation Needs (01/01/2024)   Epic    Lack of Transportation (Medical): No    Lack of Transportation (Non-Medical): No  Physical Activity: Not on file  Stress: Not on file  Social Connections: Socially Integrated (01/01/2024)   Social Connection and Isolation Panel    Frequency of Communication with Friends and Family: More than three times a week    Frequency of Social Gatherings with Friends and Family: Three times a week    Attends Religious Services: More than 4 times per year    Active Member of Clubs or Organizations: No    Attends Banker Meetings: More than 4 times per year    Marital Status: Married  Catering Manager Violence: Not At Risk (01/01/2024)   Epic    Fear of Current or Ex-Partner: No    Emotionally Abused: No    Physically Abused: No    Sexually Abused: No  Depression (PHQ2-9): Not on file  Alcohol Screen: Low Risk (01/03/2024)   Alcohol Screen    Last Alcohol Screening Score (AUDIT): 0  Housing: Low Risk (01/01/2024)   Epic    Unable to Pay for Housing in the Last Year: No    Number of Times Moved in the Last Year: 0    Homeless in the Last Year: No  Utilities: Not At Risk (01/01/2024)   Epic    Threatened with loss of utilities: No  Health Literacy: Not on file    No family history on file.  Wt Readings from Last 3 Encounters:  02/22/24 59.6 kg (131 lb 6.4 oz)  01/31/24 61.8 kg (136 lb 3.2 oz)  01/30/24 61.7 kg (136 lb)   BP 124/78   Pulse  87   Wt 59.6 kg (131 lb 6.4 oz)   SpO2 97%   BMI 24.03 kg/m   PHYSICAL EXAM: General:  well appearing.  No respiratory difficulty. Walked into clinic.  Neck: JVD flat.  Cor: Regular rate & rhythm. No murmurs. Lungs: clear Extremities: no edema  Neuro: alert & oriented x 3. Affect pleasant.   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 No prior echos done.  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  - NYHA I-II. Volume stable, ReDs 27% - Continue losartan  12.5 mg daily, doubt she'll tolerate Entresto yet  - Continue Lasix  20 mg PRN - Off Jardiance  with UTI. May be able to try Farxiga or Inpefa.  - Continue spironolactone  12.5 mg daily. - Start Toprol  XL 12.5 mg QHS - Plan to repeat echo after 2 months on GDMT. - Recent labs reviewed, stable.  - Finishing PT next week, will arrange CR.   Orthostatic hypotension - BP stable today - Resolved  COPD  - Establishing with new pulmonologist in February.   Hx breast cancer  - ER +, lymph node (-) - s/p R lumpectomy 99', s/p R mastectomy 10' for local reoccurrence.  - s/p Cytoxan and Adriamycin (99') + radiation  - Still on Arimidex  per her request, refuses to stop.   Follow up in 4 weeks with PharmD and 2 months with Dr. Zenaida + echo. If EF recovers on echo, consider graduation from AHF clinic and continue follow up with general cardiology.   Beckey LITTIE Coe AGACNP-BC  02/22/2024  "

## 2024-02-16 ENCOUNTER — Other Ambulatory Visit (HOSPITAL_COMMUNITY): Payer: Self-pay | Admitting: Internal Medicine

## 2024-02-16 ENCOUNTER — Ambulatory Visit (HOSPITAL_COMMUNITY)
Admission: RE | Admit: 2024-02-16 | Discharge: 2024-02-16 | Disposition: A | Discharge status: Home / Self Care | Source: Ambulatory Visit | Attending: Internal Medicine | Admitting: Internal Medicine

## 2024-02-16 DIAGNOSIS — I951 Orthostatic hypotension: Secondary | ICD-10-CM

## 2024-02-16 MED ORDER — GADOBUTROL 1 MMOL/ML IV SOLN
7.0000 mL | Freq: Once | INTRAVENOUS | Status: AC | PRN
  Administered 2024-02-16: 7 mL via INTRAVENOUS

## 2024-02-21 ENCOUNTER — Telehealth (HOSPITAL_COMMUNITY): Payer: Self-pay

## 2024-02-21 NOTE — Telephone Encounter (Signed)
 Called to confirm/remind patient of their appointment at the Advanced [facility] Clinic on 02/22/24 10:30.   Appointment:   [x] Confirmed  [] Left mess   [] No answer/No voice mail  [] VM Full/unable to leave message  [] Phone not in service  Patient reminded to bring all medications and/or complete list.  Confirmed patient has transportation. Gave directions, instructed to utilize valet parking.

## 2024-02-22 ENCOUNTER — Encounter (HOSPITAL_COMMUNITY): Payer: Self-pay

## 2024-02-22 ENCOUNTER — Ambulatory Visit (HOSPITAL_COMMUNITY): Payer: Self-pay | Admitting: Internal Medicine

## 2024-02-22 ENCOUNTER — Ambulatory Visit (HOSPITAL_COMMUNITY)
Admission: RE | Admit: 2024-02-22 | Discharge: 2024-02-22 | Disposition: A | Discharge status: Home / Self Care | Source: Ambulatory Visit | Attending: Internal Medicine | Admitting: Internal Medicine

## 2024-02-22 VITALS — BP 124/78 | HR 87 | Wt 131.4 lb

## 2024-02-22 DIAGNOSIS — I5042 Chronic combined systolic (congestive) and diastolic (congestive) heart failure: Secondary | ICD-10-CM | POA: Insufficient documentation

## 2024-02-22 DIAGNOSIS — Z7984 Long term (current) use of oral hypoglycemic drugs: Secondary | ICD-10-CM | POA: Insufficient documentation

## 2024-02-22 DIAGNOSIS — J449 Chronic obstructive pulmonary disease, unspecified: Secondary | ICD-10-CM | POA: Insufficient documentation

## 2024-02-22 DIAGNOSIS — I110 Hypertensive heart disease with heart failure: Secondary | ICD-10-CM | POA: Diagnosis present

## 2024-02-22 DIAGNOSIS — Z853 Personal history of malignant neoplasm of breast: Secondary | ICD-10-CM | POA: Insufficient documentation

## 2024-02-22 DIAGNOSIS — I5022 Chronic systolic (congestive) heart failure: Secondary | ICD-10-CM | POA: Diagnosis present

## 2024-02-22 DIAGNOSIS — I951 Orthostatic hypotension: Secondary | ICD-10-CM | POA: Diagnosis not present

## 2024-02-22 MED ORDER — METOPROLOL SUCCINATE ER 25 MG PO TB24: 12.5000 mg | ORAL_TABLET | Freq: Every evening | ORAL | 3 refills | Status: DC

## 2024-02-22 MED ORDER — LOSARTAN POTASSIUM 25 MG PO TABS: 12.5000 mg | ORAL_TABLET | Freq: Every evening | ORAL | 3 refills | Status: DC

## 2024-02-22 MED ORDER — LOSARTAN POTASSIUM 25 MG PO TABS: 12.5000 mg | ORAL_TABLET | Freq: Every day | ORAL | 3 refills | Status: AC

## 2024-02-22 NOTE — Progress Notes (Signed)
"   ReDS Vest / Clip - 02/22/24 1200       ReDS Vest / Clip   Station Marker A    Ruler Value 31.5    ReDS Value Range Low volume    ReDS Actual Value 27           "

## 2024-02-22 NOTE — Patient Instructions (Addendum)
 START Toprol  XL 12.5 mg ( 1/2 Tab) nightly.  Please follow up with our heart failure pharmacist in 4 weeks.  You have been referred to Cardiac Rehab. They will call you to arrange your appointment.  Your physician recommends that you schedule a follow-up appointment in: 4 months ( May) ** PLEASE CALL THE OFFICE IN MARCH TO ARRANGE YOUR FOLLOW UP APPOINTMENTS.**  If you have any questions or concerns before your next appointment please send us  a message through [facility] or call our office at [phone].    TO LEAVE A MESSAGE FOR THE NURSE SELECT OPTION 2, PLEASE LEAVE A MESSAGE INCLUDING: YOUR NAME DATE OF BIRTH CALL BACK NUMBER REASON FOR CALL**this is important as we prioritize the call backs  YOU WILL RECEIVE A CALL BACK THE SAME DAY AS LONG AS YOU CALL BEFORE 4:00 PM  At the [facility], you and your health needs are our priority. As part of our continuing mission to provide you with exceptional heart care, we have created designated Provider Care Teams. These Care Teams include your primary Cardiologist (physician) and Advanced Practice Providers (APPs- Physician Assistants and Nurse Practitioners) who all work together to provide you with the care you need, when you need it.   You may see any of the following providers on your designated Care Team at your next follow up: Dr Toribio Fuel Dr Ezra Shuck Dr. Morene Brownie Greig Mosses, NP Caffie Shed, [place] [facility] [place], [place] Beckey Coe, NP Jordan Lee, NP Ellouise Class, NP Tinnie Redman, PharmD Jaun Bash, PharmD   Please be sure to bring in all your medications bottles to every appointment.    Thank you for choosing [facility]

## 2024-02-22 NOTE — Addendum Note (Signed)
 Encounter addended by: Dante Jeannine HERO, CMA on: 02/22/2024 12:30 PM  Actions taken: Order list changed, Diagnosis association updated, Flowsheet accepted, Clinical Note Signed, Charge Capture section accepted

## 2024-02-28 ENCOUNTER — Encounter (HOSPITAL_COMMUNITY): Payer: Self-pay

## 2024-03-01 ENCOUNTER — Telehealth (HOSPITAL_COMMUNITY): Payer: Self-pay

## 2024-03-01 NOTE — Telephone Encounter (Signed)
 Patient called asking about getting scheduled in cardiac rehab, said she spoke to [place] already and confirmed interest in the program. Explained scheduling process, informed her our February schedule is not open yet but hopefully we can call her next week to schedule. Patient is extremely motivated to start as quickly as possible.

## 2024-03-04 ENCOUNTER — Telehealth (HOSPITAL_COMMUNITY): Payer: Self-pay

## 2024-03-04 NOTE — Telephone Encounter (Signed)
 Attempted to schedule cardiac rehab- no answer, left message. Sent MyChart message.  Unable to confirm BCBS benefits as their system has been consistently down and they are closed today for the holiday. Will try again later.

## 2024-03-04 NOTE — Telephone Encounter (Signed)
 Patient called back to get scheduled in the Cardiac Rehab Program. Patient will come in for orientation on 2/05 and will attend the 1:45 exercise class.  Pensions consultant.

## 2024-03-05 ENCOUNTER — Telehealth (HOSPITAL_COMMUNITY): Payer: Self-pay

## 2024-03-05 NOTE — Telephone Encounter (Signed)
 Pt insurance is active and benefits verified through Medicare A&B. Co-pay $0, DED $283/$127.41 met, out of pocket $0/$0 met, co-insurance 20%. No pre-authorization required. Passport, 03/05/2024 @ 1:02pm, REF# [id number].  2ndary insurance is active and benefits verified through [employer] Unable to verify benefits through telephone, BCBS still not answering calls.   TCR/ICR? ICR Visit(date of service)limitation? No Can multiple codes be used on the same date of service/visit?(IF ITS A LIMIT) N/A  Is this a lifetime maximum or an annual maximum? Annual Has the member used any of these services to date? No Is there a time limit (weeks/months) on start of program and/or program completion? No

## 2024-03-19 ENCOUNTER — Telehealth (HOSPITAL_COMMUNITY): Payer: Self-pay | Admitting: *Deleted

## 2024-03-19 ENCOUNTER — Other Ambulatory Visit (HOSPITAL_COMMUNITY): Payer: Self-pay

## 2024-03-19 ENCOUNTER — Encounter (HOSPITAL_COMMUNITY): Payer: Self-pay

## 2024-03-19 NOTE — Telephone Encounter (Signed)
 Spoke with the patient. Completed health history. Confirmed orientation appointment.Hadassah Elpidio Quan RN BSN

## 2024-03-20 ENCOUNTER — Ambulatory Visit (HOSPITAL_COMMUNITY)
Admission: RE | Admit: 2024-03-20 | Discharge: 2024-03-20 | Disposition: A | Source: Ambulatory Visit | Attending: Cardiology

## 2024-03-20 ENCOUNTER — Other Ambulatory Visit (HOSPITAL_COMMUNITY): Payer: Self-pay

## 2024-03-20 VITALS — BP 118/78 | HR 77 | Wt 129.0 lb

## 2024-03-20 DIAGNOSIS — I5022 Chronic systolic (congestive) heart failure: Secondary | ICD-10-CM

## 2024-03-20 MED ORDER — METOPROLOL SUCCINATE ER 25 MG PO TB24: 25.0000 mg | ORAL_TABLET | Freq: Every evening | ORAL | 3 refills | Status: AC

## 2024-03-20 MED ORDER — EMPAGLIFLOZIN 10 MG PO TABS: 10.0000 mg | ORAL_TABLET | Freq: Every day | ORAL | 11 refills | Status: AC

## 2024-03-20 MED ORDER — SPIRONOLACTONE 25 MG PO TABS: 25.0000 mg | ORAL_TABLET | Freq: Every day | ORAL | 3 refills | Status: AC

## 2024-03-20 NOTE — Progress Notes (Signed)
 "   Advanced Heart Failure Clinic Note   PCP: Pcp, No  Primary Cardiologist: Dr. Deneise  HF Cardiologist: Dr Zenaida  HPI:  Wanda Dorsey is a 79 y.o. female with HTN, COPD, chronic dyspnea, hx of breast cancer, previous GIB and recently diagnosed systolic heart failure.    Admitted 12/2023 with acute respiratory failure with hypoxia, requiring ICU and bipap. W/u showed pulmonary edema and elevated BNP. Echo showed EF 30-35% and GIIDD. Tx from [facility] to [facility] for LHC. LHC showed normal coronaries. Required IV lasix  for diuresis. Weaned down to room air. GDMT was limited 2/2 orthostatic hypotension and dizziness.    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    She returned for AHF follow up with her son on 02/22/24. Overall felt very well. Denied palpitations, CP, dizziness, edema, or PND/Orthopnea. No SOB. Appetite was ok. No fever or chills. Weight at home 128-129 pounds. Reported taking all medications. Denied ETOH, tobacco or drug use. Remained constantly busy in her house. Patient was still working with PT, reported using a weighted vest around the house while doing ADLs.   Today she returns to HF clinic with her son for pharmacist medication titration. At last visit with NP, metoprolol  succinate 12.5 mg daily was initiated. Patient reports overall doing well except for worsening SOB since stopping the Jardiance . Patient expresses interest in restarting Jardiance  as she does not believe this was the cause of her recent yeast infection. She reports SOB mainly occurring at night, endorses 2 pillow orthopnea. States her breathing is not limiting any of her ADLs, frequently does house chores around her two-story home. Denies dizziness, lightheadedness, or fatigue. Does not check BP at home but states she owns a BP cuff. BP stable in clinic at  118/78 mmHg. Denies CP or palpitations. Patient checks weight daily at home, trending down to 125 lbs. No LEE noted. Patient has not taken any doses of her PRN furosemide . Reports appetite has remained the same since hospital discharge, states she eats two large meals per day. Reports adherence to all of her medications. Patient is eager to increase her medication doses. Reports tolerating metoprolol  succinate 200 mg daily and lisinopril  20 mg daily prior to her recent hospitalization.  HF Medications: Metoprolol  succinate 12.5 mg daily Losartan  12.5 mg daily Spironolactone  12.5 mg daily Furosemide  20 mg PRN  Has the patient been experiencing any side effects to the medications prescribed?  None reported by the patient.   Does the patient have any problems obtaining medications due to transportation or finances?   No, patient has SilverScript coverage (Medicare Part D)  Understanding of regimen: good Understanding of indications: good Potential of compliance: good Patient understands to avoid NSAIDs. Patient understands to avoid decongestants.    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 No prior echos done.  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  - NYHA I-II. Euvolemic on exam. Weight trending down, do not suspect SOB is volume related. ReDs on 02/22/24 was 27%.  - Continue furosemide  20  mg PRN - Increase metoprolol  succinate to 25 mg at bedtime. Previously tolerated metoprolol  succinate 200 mg daily prior to recent hospitalization. - Continue losartan  12.5 mg daily. Consider switching to Entresto at follow-up visit if BP remains stable.  - Increase spironolactone  to 25 mg daily. Repeat BMET at follow-up in 2 weeks.  - Restart Jardiance  10 mg daily, patient expressed interest in restarting. Counseled patient on s/sx of UTI and yeast infection, encouraged patient to contact clinic if having any concerning symptoms.  - Plan to repeat echo after 2 months on GDMT, scheduled 06/05/24. - Recent labs reviewed, stable.  - Per NP, if EF recovers on ECHO, will consider graduation from AHF clinic and continue to follow with general cardiology   Orthostatic hypotension - BP stable today - Resolved   COPD  - Establishing with new pulmonologist in February.    Hx breast cancer  - ER +, lymph node (-) - s/p R lumpectomy 99', s/p R mastectomy 10' for local reoccurrence.  - s/p Cytoxan and Adriamycin (99') + radiation  - Still on Arimidex  per her request, refuses to stop.   Follow up in 2 weeks with pharmacy clinic Follow up in 2 months with Dr. Zenaida Morna Breach, PharmD, BCPS PGY2 Cardiology Pharmacy Resident  Tinnie Redman, PharmD, BCPS, [facility], CPP Heart Failure Clinic Pharmacist [phone] "

## 2024-03-20 NOTE — Patient Instructions (Signed)
 It was a pleasure seeing you today!  MEDICATIONS: -We are changing your medications today -Increase spironolactone  to 25 mg (1 tablet) daily -Increase metoprolol  succinate to 25 mg (1 tablet) daily -Restart Jardiance  10 mg (1 tablet) daily -Call if you have questions about your medications.  NEXT APPOINTMENT: Return to clinic in 2 weeks with pharmacy clinic.  In general, to take care of your heart failure: -Limit your fluid intake to 2 Liters (half-gallon) per day.   -Limit your salt intake to ideally 2-3 grams (2000-3000 mg) per day. -Weigh yourself daily and record, and bring that weight diary to your next appointment.  (Weight gain of 2-3 pounds in 1 day typically means fluid weight.) -The medications for your heart are to help your heart and help you live longer.   -Please contact us  before stopping any of your heart medications.  Call the clinic at [phone] with questions or to reschedule future appointments.

## 2024-03-21 ENCOUNTER — Encounter (HOSPITAL_COMMUNITY): Payer: Self-pay

## 2024-03-21 ENCOUNTER — Telehealth (HOSPITAL_COMMUNITY): Payer: Self-pay

## 2024-03-21 ENCOUNTER — Encounter (HOSPITAL_COMMUNITY)
Admission: RE | Admit: 2024-03-21 | Discharge: 2024-03-21 | Disposition: A | Source: Ambulatory Visit | Attending: Cardiology

## 2024-03-21 VITALS — BP 112/70 | HR 80 | Ht 62.5 in | Wt 129.9 lb

## 2024-03-21 DIAGNOSIS — I5022 Chronic systolic (congestive) heart failure: Secondary | ICD-10-CM

## 2024-03-21 HISTORY — DX: Heart failure, unspecified: I50.9

## 2024-03-21 NOTE — Progress Notes (Signed)
 Cardiac Individual Treatment Plan  Patient Details  Name: Wanda Dorsey MRN: [id number] Date of Birth: 04/22/1945 Referring Provider:   Flowsheet Row INTENSIVE CARDIAC REHAB ORIENT from 03/21/2024 in [facility]  Referring Provider Morene Brownie MD    Initial Encounter Date:  Flowsheet Row INTENSIVE CARDIAC REHAB ORIENT from 03/21/2024 in [facility]  Date 03/21/24    Visit Diagnosis: Heart failure, chronic systolic (HCC)  Patient's Home Medications on Admission: Current Medications[1]  Past Medical History: Past Medical History:  Diagnosis Date   Cancer (HCC)    CHF (congestive heart failure) (HCC)    Hearing loss    Hypertension     Tobacco Use: Tobacco Use History[2]  Labs: Review Flowsheet       Latest Ref Rng & Units 01/01/2024  Labs for ITP Cardiac and Pulmonary Rehab  Cholestrol 0 - 200 mg/dL 821   LDL (calc) 0 - 99 mg/dL 75   HDL-C >59 mg/dL 92   Trlycerides <849 mg/dL 53   Bicarbonate 79.9 - 28.0 mmol/L 29.1   TCO2 22 - 32 mmol/L 25   O2 Saturation % 40.2     Capillary Blood Glucose: No results found for: GLUCAP   Exercise Target Goals: Exercise Program Goal: Individual exercise prescription set using results from initial 6 min walk test and THRR while considering  patients activity barriers and safety.   Exercise Prescription Goal: Initial exercise prescription builds to 30-45 minutes a day of aerobic activity, 2-3 days per week.  Home exercise guidelines will be given to patient during program as part of exercise prescription that the participant will acknowledge.  Activity Barriers & Risk Stratification:  Activity Barriers & Cardiac Risk Stratification - 03/21/24 1004       Activity Barriers & Cardiac Risk Stratification   Activity Barriers Balance Concerns;Deconditioning;Shortness of Breath;Decreased Ventricular Function     Cardiac Risk Stratification High          6 Minute Walk:  6 Minute Walk     Row Name 03/21/24 0921         6 Minute Walk   Phase Initial     Distance 1628 feet     Walk Time 6 minutes     # of Rest Breaks 0     MPH 3.1     METS 3.1     RPE 11     Perceived Dyspnea  0     VO2 Peak 10.8     Symptoms No     Resting HR 80 bpm     Resting BP 112/70     Resting Oxygen Saturation  97 %     Exercise Oxygen Saturation  during 6 min walk 100 %     Max Ex. HR 106 bpm     Max Ex. BP 120/74     2 Minute Post BP 110/68        Oxygen Initial Assessment:   Oxygen Re-Evaluation:   Oxygen Discharge (Final Oxygen Re-Evaluation):   Initial Exercise Prescription:  Initial Exercise Prescription - 03/21/24 1000       Date of Initial Exercise RX and Referring Provider   Date 03/21/24    Referring Provider Morene Brownie MD    Expected Discharge Date 06/12/24      NuStep   Level 1    SPM 75    Minutes 15    METs 2.5  Arm Ergometer   Level 1    Watts 10    RPM 40    Minutes 15    METs 1.9      Prescription Details   Frequency (times per week) 3    Duration Progress to 30 minutes of continuous aerobic without signs/symptoms of physical distress      Intensity   THRR 40-80% of Max Heartrate 56-113    Ratings of Perceived Exertion 11-13    Perceived Dyspnea 0-4      Progression   Progression Continue to progress workloads to maintain intensity without signs/symptoms of physical distress.      Resistance Training   Training Prescription Yes    Weight 2    Reps 10-15          Perform Capillary Blood Glucose checks as needed.  Exercise Prescription Changes:   Exercise Comments:   Exercise Goals and Review:   Exercise Goals     Row Name 03/21/24 1019             Exercise Goals   Increase Physical Activity Yes       Intervention Provide advice, education, support and counseling about physical activity/exercise needs.;Develop an  individualized exercise prescription for aerobic and resistive training based on initial evaluation findings, risk stratification, comorbidities and participant's personal goals.       Expected Outcomes Short Term: Attend rehab on a regular basis to increase amount of physical activity.;Long Term: Exercising regularly at least 3-5 days a week.;Long Term: Add in home exercise to make exercise part of routine and to increase amount of physical activity.       Increase Strength and Stamina Yes       Intervention Provide advice, education, support and counseling about physical activity/exercise needs.;Develop an individualized exercise prescription for aerobic and resistive training based on initial evaluation findings, risk stratification, comorbidities and participant's personal goals.       Expected Outcomes Short Term: Increase workloads from initial exercise prescription for resistance, speed, and METs.;Short Term: Perform resistance training exercises routinely during rehab and add in resistance training at home;Long Term: Improve cardiorespiratory fitness, muscular endurance and strength as measured by increased METs and functional capacity ( )       Able to understand and use rate of perceived exertion (RPE) scale Yes       Intervention Provide education and explanation on how to use RPE scale       Expected Outcomes Short Term: Able to use RPE daily in rehab to express subjective intensity level;Long Term:  Able to use RPE to guide intensity level when exercising independently       Able to understand and use Dyspnea scale Yes       Intervention Provide education and explanation on how to use Dyspnea scale       Expected Outcomes Short Term: Able to use Dyspnea scale daily in rehab to express subjective sense of shortness of breath during exertion;Long Term: Able to use Dyspnea scale to guide intensity level when exercising independently       Knowledge and understanding of Target Heart Rate Range  (THRR) Yes       Intervention Provide education and explanation of THRR including how the numbers were predicted and where they are located for reference       Expected Outcomes Short Term: Able to state/look up THRR;Long Term: Able to use THRR to govern intensity when exercising independently;Short Term: Able to use daily as guideline for intensity in  rehab       Understanding of Exercise Prescription Yes       Intervention Provide education, explanation, and written materials on patient's individual exercise prescription       Expected Outcomes Short Term: Able to explain program exercise prescription;Long Term: Able to explain home exercise prescription to exercise independently          Exercise Goals Re-Evaluation :   Discharge Exercise Prescription (Final Exercise Prescription Changes):   Nutrition:  Target Goals: Understanding of nutrition guidelines, daily intake of sodium 1500mg , cholesterol 200mg , calories 30% from fat and 7% or less from saturated fats, daily to have 5 or more servings of fruits and vegetables.  Biometrics:  Pre Biometrics - 03/21/24 0850       Pre Biometrics   Waist Circumference 36.75 inches    Hip Circumference 37.25 inches    Waist to Hip Ratio 0.99 %    Triceps Skinfold 22 mm    % Body Fat 37.2 %    Grip Strength 8 kg    Flexibility 14 in    Single Leg Stand 12.6 seconds           Nutrition Therapy Plan and Nutrition Goals:   Nutrition Assessments:  MEDIFICTS Score Key: >=70 Need to make dietary changes  40-70 Heart Healthy Diet <= 40 Therapeutic Level Cholesterol Diet    Picture Your Plate Scores: <59 Unhealthy dietary pattern with much room for improvement. 41-50 Dietary pattern unlikely to meet recommendations for good health and room for improvement. 51-60 More healthful dietary pattern, with some room for improvement.  >60 Healthy dietary pattern, although there may be some specific behaviors that could be improved.     Nutrition Goals Re-Evaluation:   Nutrition Goals Re-Evaluation:   Nutrition Goals Discharge (Final Nutrition Goals Re-Evaluation):   Psychosocial: Target Goals: Acknowledge presence or absence of significant depression and/or stress, maximize coping skills, provide positive support system. Participant is able to verbalize types and ability to use techniques and skills needed for reducing stress and depression.  Initial Review & Psychosocial Screening:  Initial Psych Review & Screening - 03/21/24 0907       Initial Review   Current issues with Current Stress Concerns;Current Anxiety/Panic    Source of Stress Concerns Family;Chronic Illness    Comments BJ says that again is a source of anxiety for her. BJ says that her husband is disabled      Family Dynamics   Good Support System? Yes   BJ has her son, husband and a sister in law who lives an hour and a half  away for support.     Barriers   Psychosocial barriers to participate in program The patient should benefit from training in stress management and relaxation.      Screening Interventions   Interventions Encouraged to exercise    Expected Outcomes Long Term Goal: Stressors or current issues are controlled or eliminated.          Quality of Life Scores:  Quality of Life - 03/21/24 1028       Quality of Life   Select Quality of Life      Quality of Life Scores   Health/Function Pre 26.07 %    Socioeconomic Pre 28.21 %    Psych/Spiritual Pre 28.57 %    Family Pre 30 %    GLOBAL Pre 27.6 %         Scores of 19 and below usually indicate a poorer quality of life in  these areas.  A difference of  2-3 points is a clinically meaningful difference.  A difference of 2-3 points in the total score of the Quality of Life Index has been associated with significant improvement in overall quality of life, self-image, physical symptoms, and general health in studies assessing change in quality of life.  PHQ-9: Review  Flowsheet       03/21/2024  Depression screen PHQ 2/9  Decreased Interest 0  Down, Depressed, Hopeless 0  PHQ - 2 Score 0  Altered sleeping 1  Tired, decreased energy 0  Change in appetite 0  Feeling bad or failure about yourself  0  Trouble concentrating 0  Moving slowly or fidgety/restless 0  Suicidal thoughts 0  PHQ-9 Score 1  Difficult doing work/chores Not difficult at all   Interpretation of Total Score  Total Score Depression Severity:  1-4 = Minimal depression, 5-9 = Mild depression, 10-14 = Moderate depression, 15-19 = Moderately severe depression, 20-27 = Severe depression   Psychosocial Evaluation and Intervention:   Psychosocial Re-Evaluation:   Psychosocial Discharge (Final Psychosocial Re-Evaluation):   Vocational Rehabilitation: Provide vocational rehab assistance to qualifying candidates.   Vocational Rehab Evaluation & Intervention:  Vocational Rehab - 03/21/24 0913       Initial Vocational Rehab Evaluation & Intervention   Assessment shows need for Vocational Rehabilitation No   BJ is retired and does not need vocational rehab at this time.         Education: Education Goals: Education classes will be provided on a weekly basis, covering required topics. Participant will state understanding/return demonstration of topics presented.     Core Videos: Exercise    Move It!  Clinical staff conducted group or individual video education with verbal and written material and guidebook.  Patient learns the recommended Pritikin exercise program. Exercise with the goal of living a long, healthy life. Some of the health benefits of exercise include controlled diabetes, healthier blood pressure levels, improved cholesterol levels, improved heart and lung capacity, improved sleep, and better body composition. Everyone should speak with their doctor before starting or changing an exercise routine.  Biomechanical Limitations Clinical staff conducted group or  individual video education with verbal and written material and guidebook.  Patient learns how biomechanical limitations can impact exercise and how we can mitigate and possibly overcome limitations to have an impactful and balanced exercise routine.  Body Composition Clinical staff conducted group or individual video education with verbal and written material and guidebook.  Patient learns that body composition (ratio of muscle mass to fat mass) is a key component to assessing overall fitness, rather than body weight alone. Increased fat mass, especially visceral belly fat, can put us  at increased risk for metabolic syndrome, type 2 diabetes, heart disease, and even death. It is recommended to combine diet and exercise (cardiovascular and resistance training) to improve your body composition. Seek guidance from your physician and exercise physiologist before implementing an exercise routine.  Exercise Action Plan Clinical staff conducted group or individual video education with verbal and written material and guidebook.  Patient learns the recommended strategies to achieve and enjoy long-term exercise adherence, including variety, self-motivation, self-efficacy, and positive decision making. Benefits of exercise include fitness, good health, weight management, more energy, better sleep, less stress, and overall well-being.  Medical   Heart Disease Risk Reduction Clinical staff conducted group or individual video education with verbal and written material and guidebook.  Patient learns our heart is our most vital organ as it circulates  oxygen, nutrients, white blood cells, and hormones throughout the entire body, and carries waste away. Data supports a plant-based eating plan like the Pritikin Program for its effectiveness in slowing progression of and reversing heart disease. The video provides a number of recommendations to address heart disease.   Metabolic Syndrome and Belly Fat  Clinical staff  conducted group or individual video education with verbal and written material and guidebook.  Patient learns what metabolic syndrome is, how it leads to heart disease, and how one can reverse it and keep it from coming back. You have metabolic syndrome if you have 3 of the following 5 criteria: abdominal obesity, high blood pressure, high triglycerides, low HDL cholesterol, and high blood sugar.  Hypertension and Heart Disease Clinical staff conducted group or individual video education with verbal and written material and guidebook.  Patient learns that high blood pressure, or hypertension, is very common in the [place] . Hypertension is largely due to excessive salt intake, but other important risk factors include being overweight, physical inactivity, drinking too much alcohol, smoking, and not eating enough potassium from fruits and vegetables. High blood pressure is a leading risk factor for heart attack, stroke, congestive heart failure, dementia, kidney failure, and premature death. Long-term effects of excessive salt intake include stiffening of the arteries and thickening of heart muscle and organ damage. Recommendations include ways to reduce hypertension and the risk of heart disease.  Diseases of Our Time - Focusing on Diabetes Clinical staff conducted group or individual video education with verbal and written material and guidebook.  Patient learns why the best way to stop diseases of our time is prevention, through food and other lifestyle changes. Medicine (such as prescription pills and surgeries) is often only a Band-Aid on the problem, not a long-term solution. Most common diseases of our time include obesity, type 2 diabetes, hypertension, heart disease, and cancer. The Pritikin Program is recommended and has been proven to help reduce, reverse, and/or prevent the damaging effects of metabolic syndrome.  Nutrition   Overview of the Pritikin Eating Plan  Clinical staff conducted  group or individual video education with verbal and written material and guidebook.  Patient learns about the Pritikin Eating Plan for disease risk reduction. The Pritikin Eating Plan emphasizes a wide variety of unrefined, minimally-processed carbohydrates, like fruits, vegetables, whole grains, and legumes. Go, Caution, and Stop food choices are explained. Plant-based and lean animal proteins are emphasized. Rationale provided for low sodium intake for blood pressure control, low added sugars for blood sugar stabilization, and low added fats and oils for coronary artery disease risk reduction and weight management.  Calorie Density  Clinical staff conducted group or individual video education with verbal and written material and guidebook.  Patient learns about calorie density and how it impacts the Pritikin Eating Plan. Knowing the characteristics of the food you choose will help you decide whether those foods will lead to weight gain or weight loss, and whether you want to consume more or less of them. Weight loss is usually a side effect of the Pritikin Eating Plan because of its focus on low calorie-dense foods.  Label Reading  Clinical staff conducted group or individual video education with verbal and written material and guidebook.  Patient learns about the Pritikin recommended label reading guidelines and corresponding recommendations regarding calorie density, added sugars, sodium content, and whole grains.  Dining Out - Part 1  Clinical staff conducted group or individual video education with verbal and written material and guidebook.  Patient learns that restaurant meals can be sabotaging because they can be so high in calories, fat, sodium, and/or sugar. Patient learns recommended strategies on how to positively address this and avoid unhealthy pitfalls.  Facts on Fats  Clinical staff conducted group or individual video education with verbal and written material and guidebook.  Patient  learns that lifestyle modifications can be just as effective, if not more so, as many medications for lowering your risk of heart disease. A Pritikin lifestyle can help to reduce your risk of inflammation and atherosclerosis (cholesterol build-up, or plaque, in the artery walls). Lifestyle interventions such as dietary choices and physical activity address the cause of atherosclerosis. A review of the types of fats and their impact on blood cholesterol levels, along with dietary recommendations to reduce fat intake is also included.  Nutrition Action Plan  Clinical staff conducted group or individual video education with verbal and written material and guidebook.  Patient learns how to incorporate Pritikin recommendations into their lifestyle. Recommendations include planning and keeping personal health goals in mind as an important part of their success.  Healthy Mind-Set    Healthy Minds, Bodies, Hearts  Clinical staff conducted group or individual video education with verbal and written material and guidebook.  Patient learns how to identify when they are stressed. Video will discuss the impact of that stress, as well as the many benefits of stress management. Patient will also be introduced to stress management techniques. The way we think, act, and feel has an impact on our hearts.  How Our Thoughts Can Heal Our Hearts  Clinical staff conducted group or individual video education with verbal and written material and guidebook.  Patient learns that negative thoughts can cause depression and anxiety. This can result in negative lifestyle behavior and serious health problems. Cognitive behavioral therapy is an effective method to help control our thoughts in order to change and improve our emotional outlook.  Additional Videos:  Exercise    Improving Performance  Clinical staff conducted group or individual video education with verbal and written material and guidebook.  Patient learns to use a  non-linear approach by alternating intensity levels and lengths of time spent exercising to help burn more calories and lose more body fat. Cardiovascular exercise helps improve heart health, metabolism, hormonal balance, blood sugar control, and recovery from fatigue. Resistance training improves strength, endurance, balance, coordination, reaction time, metabolism, and muscle mass. Flexibility exercise improves circulation, posture, and balance. Seek guidance from your physician and exercise physiologist before implementing an exercise routine and learn your capabilities and proper form for all exercise.  Introduction to Yoga  Clinical staff conducted group or individual video education with verbal and written material and guidebook.  Patient learns about yoga, a discipline of the coming together of mind, breath, and body. The benefits of yoga include improved flexibility, improved range of motion, better posture and core strength, increased lung function, weight loss, and positive self-image. Yogas heart health benefits include lowered blood pressure, healthier heart rate, decreased cholesterol and triglyceride levels, improved immune function, and reduced stress. Seek guidance from your physician and exercise physiologist before implementing an exercise routine and learn your capabilities and proper form for all exercise.  Medical   Aging: Enhancing Your Quality of Life  Clinical staff conducted group or individual video education with verbal and written material and guidebook.  Patient learns key strategies and recommendations to stay in good physical health and enhance quality of life, such as prevention strategies, having an advocate,  securing a Health Care Proxy and Power of Attorney, and keeping a list of medications and system for tracking them. It also discusses how to avoid risk for bone loss.  Biology of Weight Control  Clinical staff conducted group or individual video education with  verbal and written material and guidebook.  Patient learns that weight gain occurs because we consume more calories than we burn (eating more, moving less). Even if your body weight is normal, you may have higher ratios of fat compared to muscle mass. Too much body fat puts you at increased risk for cardiovascular disease, heart attack, stroke, type 2 diabetes, and obesity-related cancers. In addition to exercise, following the Pritikin Eating Plan can help reduce your risk.  Decoding Lab Results  Clinical staff conducted group or individual video education with verbal and written material and guidebook.  Patient learns that lab test reflects one measurement whose values change over time and are influenced by many factors, including medication, stress, sleep, exercise, food, hydration, pre-existing medical conditions, and more. It is recommended to use the knowledge from this video to become more involved with your lab results and evaluate your numbers to speak with your doctor.   Diseases of Our Time - Overview  Clinical staff conducted group or individual video education with verbal and written material and guidebook.  Patient learns that according to the CDC, 50% to 70% of chronic diseases (such as obesity, type 2 diabetes, elevated lipids, hypertension, and heart disease) are avoidable through lifestyle improvements including healthier food choices, listening to satiety cues, and increased physical activity.  Sleep Disorders Clinical staff conducted group or individual video education with verbal and written material and guidebook.  Patient learns how good quality and duration of sleep are important to overall health and well-being. Patient also learns about sleep disorders and how they impact health along with recommendations to address them, including discussing with a physician.  Nutrition  Dining Out - Part 2 Clinical staff conducted group or individual video education with verbal and  written material and guidebook.  Patient learns how to plan ahead and communicate in order to maximize their dining experience in a healthy and nutritious manner. Included are recommended food choices based on the type of restaurant the patient is visiting.   Fueling a Banker conducted group or individual video education with verbal and written material and guidebook.  There is a strong connection between our food choices and our health. Diseases like obesity and type 2 diabetes are very prevalent and are in large-part due to lifestyle choices. The Pritikin Eating Plan provides plenty of food and hunger-curbing satisfaction. It is easy to follow, affordable, and helps reduce health risks.  Menu Workshop  Clinical staff conducted group or individual video education with verbal and written material and guidebook.  Patient learns that restaurant meals can sabotage health goals because they are often packed with calories, fat, sodium, and sugar. Recommendations include strategies to plan ahead and to communicate with the manager, chef, or server to help order a healthier meal.  Planning Your Eating Strategy  Clinical staff conducted group or individual video education with verbal and written material and guidebook.  Patient learns about the Pritikin Eating Plan and its benefit of reducing the risk of disease. The Pritikin Eating Plan does not focus on calories. Instead, it emphasizes high-quality, nutrient-rich foods. By knowing the characteristics of the foods, we choose, we can determine their calorie density and make informed decisions.  Targeting Your Nutrition  Priorities  Clinical staff conducted group or individual video education with verbal and written material and guidebook.  Patient learns that lifestyle habits have a tremendous impact on disease risk and progression. This video provides eating and physical activity recommendations based on your personal health goals,  such as reducing LDL cholesterol, losing weight, preventing or controlling type 2 diabetes, and reducing high blood pressure.  Vitamins and Minerals  Clinical staff conducted group or individual video education with verbal and written material and guidebook.  Patient learns different ways to obtain key vitamins and minerals, including through a recommended healthy diet. It is important to discuss all supplements you take with your doctor.   Healthy Mind-Set    Smoking Cessation  Clinical staff conducted group or individual video education with verbal and written material and guidebook.  Patient learns that cigarette smoking and tobacco addiction pose a serious health risk which affects millions of people. Stopping smoking will significantly reduce the risk of heart disease, lung disease, and many forms of cancer. Recommended strategies for quitting are covered, including working with your doctor to develop a successful plan.  Culinary   Becoming a Set Designer conducted group or individual video education with verbal and written material and guidebook.  Patient learns that cooking at home can be healthy, cost-effective, quick, and puts them in control. Keys to cooking healthy recipes will include looking at your recipe, assessing your equipment needs, planning ahead, making it simple, choosing cost-effective seasonal ingredients, and limiting the use of added fats, salts, and sugars.  Cooking - Breakfast and Snacks  Clinical staff conducted group or individual video education with verbal and written material and guidebook.  Patient learns how important breakfast is to satiety and nutrition through the entire day. Recommendations include key foods to eat during breakfast to help stabilize blood sugar levels and to prevent overeating at meals later in the day. Planning ahead is also a key component.  Cooking - Educational Psychologist conducted group or individual video  education with verbal and written material and guidebook.  Patient learns eating strategies to improve overall health, including an approach to cook more at home. Recommendations include thinking of animal protein as a side on your plate rather than center stage and focusing instead on lower calorie dense options like vegetables, fruits, whole grains, and plant-based proteins, such as beans. Making sauces in large quantities to freeze for later and leaving the skin on your vegetables are also recommended to maximize your experience.  Cooking - Healthy Salads and Dressing Clinical staff conducted group or individual video education with verbal and written material and guidebook.  Patient learns that vegetables, fruits, whole grains, and legumes are the foundations of the Pritikin Eating Plan. Recommendations include how to incorporate each of these in flavorful and healthy salads, and how to create homemade salad dressings. Proper handling of ingredients is also covered. Cooking - Soups and [employer] - Soups and Desserts Clinical staff conducted group or individual video education with verbal and written material and guidebook.  Patient learns that Pritikin soups and desserts make for easy, nutritious, and delicious snacks and meal components that are low in sodium, fat, sugar, and calorie density, while high in vitamins, minerals, and filling fiber. Recommendations include simple and healthy ideas for soups and desserts.   Overview     The Pritikin Solution Program Overview Clinical staff conducted group or individual video education with verbal and written material and guidebook.  Patient  learns that the results of the Pritikin Program have been documented in more than 100 articles published in peer-reviewed journals, and the benefits include reducing risk factors for (and, in some cases, even reversing) high cholesterol, high blood pressure, type 2 diabetes, obesity, and more! An overview of  the three key pillars of the Pritikin Program will be covered: eating well, doing regular exercise, and having a healthy mind-set.  WORKSHOPS  Exercise: Exercise Basics: Building Your Action Plan Clinical staff led group instruction and group discussion with PowerPoint presentation and patient guidebook. To enhance the learning environment the use of posters, models and videos may be added. At the conclusion of this workshop, patients will comprehend the difference between physical activity and exercise, as well as the benefits of incorporating both, into their routine. Patients will understand the FITT (Frequency, Intensity, Time, and Type) principle and how to use it to build an exercise action plan. In addition, safety concerns and other considerations for exercise and cardiac rehab will be addressed by the presenter. The purpose of this lesson is to promote a comprehensive and effective weekly exercise routine in order to improve patients overall level of fitness.   Managing Heart Disease: Your Path to a Healthier Heart Clinical staff led group instruction and group discussion with PowerPoint presentation and patient guidebook. To enhance the learning environment the use of posters, models and videos may be added.At the conclusion of this workshop, patients will understand the anatomy and physiology of the heart. Additionally, they will understand how Pritikins three pillars impact the risk factors, the progression, and the management of heart disease.  The purpose of this lesson is to provide a high-level overview of the heart, heart disease, and how the Pritikin lifestyle positively impacts risk factors.  Exercise Biomechanics Clinical staff led group instruction and group discussion with PowerPoint presentation and patient guidebook. To enhance the learning environment the use of posters, models and videos may be added. Patients will learn how the structural parts of their bodies  function and how these functions impact their daily activities, movement, and exercise. Patients will learn how to promote a neutral spine, learn how to manage pain, and identify ways to improve their physical movement in order to promote healthy living. The purpose of this lesson is to expose patients to common physical limitations that impact physical activity. Participants will learn practical ways to adapt and manage aches and pains, and to minimize their effect on regular exercise. Patients will learn how to maintain good posture while sitting, walking, and lifting.  Balance Training and Fall Prevention  Clinical staff led group instruction and group discussion with PowerPoint presentation and patient guidebook. To enhance the learning environment the use of posters, models and videos may be added. At the conclusion of this workshop, patients will understand the importance of their sensorimotor skills (vision, proprioception, and the vestibular system) in maintaining their ability to balance as they age. Patients will apply a variety of balancing exercises that are appropriate for their current level of function. Patients will understand the common causes for poor balance, possible solutions to these problems, and ways to modify their physical environment in order to minimize their fall risk. The purpose of this lesson is to teach patients about the importance of maintaining balance as they age and ways to minimize their risk of falling.  WORKSHOPS   Nutrition:  Fueling a Ship Broker led group instruction and group discussion with PowerPoint presentation and patient guidebook. To enhance the learning environment  the use of posters, models and videos may be added. Patients will review the foundational principles of the Pritikin Eating Plan and understand what constitutes a serving size in each of the food groups. Patients will also learn Pritikin-friendly foods that are better  choices when away from home and review make-ahead meal and snack options. Calorie density will be reviewed and applied to three nutrition priorities: weight maintenance, weight loss, and weight gain. The purpose of this lesson is to reinforce (in a group setting) the key concepts around what patients are recommended to eat and how to apply these guidelines when away from home by planning and selecting Pritikin-friendly options. Patients will understand how calorie density may be adjusted for different weight management goals.  Mindful Eating  Clinical staff led group instruction and group discussion with PowerPoint presentation and patient guidebook. To enhance the learning environment the use of posters, models and videos may be added. Patients will briefly review the concepts of the Pritikin Eating Plan and the importance of low-calorie dense foods. The concept of mindful eating will be introduced as well as the importance of paying attention to internal hunger signals. Triggers for non-hunger eating and techniques for dealing with triggers will be explored. The purpose of this lesson is to provide patients with the opportunity to review the basic principles of the Pritikin Eating Plan, discuss the value of eating mindfully and how to measure internal cues of hunger and fullness using the Hunger Scale. Patients will also discuss reasons for non-hunger eating and learn strategies to use for controlling emotional eating.  Targeting Your Nutrition Priorities Clinical staff led group instruction and group discussion with PowerPoint presentation and patient guidebook. To enhance the learning environment the use of posters, models and videos may be added. Patients will learn how to determine their genetic susceptibility to disease by reviewing their family history. Patients will gain insight into the importance of diet as part of an overall healthy lifestyle in mitigating the impact of genetics and other  environmental insults. The purpose of this lesson is to provide patients with the opportunity to assess their personal nutrition priorities by looking at their family history, their own health history and current risk factors. Patients will also be able to discuss ways of prioritizing and modifying the Pritikin Eating Plan for their highest risk areas  Menu  Clinical staff led group instruction and group discussion with PowerPoint presentation and patient guidebook. To enhance the learning environment the use of posters, models and videos may be added. Using menus brought in from [employer], or printed from [employer], patients will apply the Pritikin dining out guidelines that were presented in the [employer] video. Patients will also be able to practice these guidelines in a variety of provided scenarios. The purpose of this lesson is to provide patients with the opportunity to practice hands-on learning of the Pritikin Dining Out guidelines with actual menus and practice scenarios.  Label Reading Clinical staff led group instruction and group discussion with PowerPoint presentation and patient guidebook. To enhance the learning environment the use of posters, models and videos may be added. Patients will review and discuss the Pritikin label reading guidelines presented in Pritikins Label Reading Educational series video. Using fool labels brought in from local grocery stores and markets, patients will apply the label reading guidelines and determine if the packaged food meet the Pritikin guidelines. The purpose of this lesson is to provide patients with the opportunity to review, discuss, and practice hands-on  learning of the Pritikin Label Reading guidelines with actual packaged food labels. Cooking School  Pritikins [employer] are designed to teach patients ways to prepare quick, simple, and affordable recipes at home. The importance of nutritions role in  chronic disease risk reduction is reflected in its emphasis in the overall Pritikin program. By learning how to prepare essential core Pritikin Eating Plan recipes, patients will increase control over what they eat; be able to customize the flavor of foods without the use of added salt, sugar, or fat; and improve the quality of the food they consume. By learning a set of core recipes which are easily assembled, quickly prepared, and affordable, patients are more likely to prepare more healthy foods at home. These workshops focus on convenient breakfasts, simple entres, side dishes, and desserts which can be prepared with minimal effort and are consistent with nutrition recommendations for cardiovascular risk reduction. Cooking [employer] are taught by a armed forces logistics/support/administrative officer (RD) who has been trained by the [employer]. The chef or RD has a clear understanding of the importance of minimizing - if not completely eliminating - added fat, sugar, and sodium in recipes. Throughout the series of Cooking School Workshop sessions, patients will learn about healthy ingredients and efficient methods of cooking to build confidence in their capability to prepare    Cooking School weekly topics:  Adding Flavor- Sodium-Free  Fast and Healthy Breakfasts  Powerhouse Plant-Based Proteins  Satisfying Salads and Dressings  Simple Sides and Sauces  International Cuisine-Spotlight on the [employer] Zones  Delicious Desserts  Savory Soups  [employer] - Meals in a Astronomer Appetizers and Snacks  Comforting Weekend Breakfasts  One-Pot Wonders   Fast Evening Meals  Landscape Architect Your Pritikin Plate  WORKSHOPS   Healthy Mindset (Psychosocial):  Focused Goals, Sustainable Changes Clinical staff led group instruction and group discussion with PowerPoint presentation and patient guidebook. To enhance the learning environment the use of posters, models and videos may  be added. Patients will be able to apply effective goal setting strategies to establish at least one personal goal, and then take consistent, meaningful action toward that goal. They will learn to identify common barriers to achieving personal goals and develop strategies to overcome them. Patients will also gain an understanding of how our mind-set can impact our ability to achieve goals and the importance of cultivating a positive and growth-oriented mind-set. The purpose of this lesson is to provide patients with a deeper understanding of how to set and achieve personal goals, as well as the tools and strategies needed to overcome common obstacles which may arise along the way.  From Head to Heart: The Power of a Healthy Outlook  Clinical staff led group instruction and group discussion with PowerPoint presentation and patient guidebook. To enhance the learning environment the use of posters, models and videos may be added. Patients will be able to recognize and describe the impact of emotions and mood on physical health. They will discover the importance of self-care and explore self-care practices which may work for them. Patients will also learn how to utilize the 4 Cs to cultivate a healthier outlook and better manage stress and challenges. The purpose of this lesson is to demonstrate to patients how a healthy outlook is an essential part of maintaining good health, especially as they continue their cardiac rehab journey.  Healthy Sleep for a Healthy Heart Clinical staff led group instruction and group discussion with PowerPoint presentation  and patient guidebook. To enhance the learning environment the use of posters, models and videos may be added. At the conclusion of this workshop, patients will be able to demonstrate knowledge of the importance of sleep to overall health, well-being, and quality of life. They will understand the symptoms of, and treatments for, common sleep disorders. Patients  will also be able to identify daytime and nighttime behaviors which impact sleep, and they will be able to apply these tools to help manage sleep-related challenges. The purpose of this lesson is to provide patients with a general overview of sleep and outline the importance of quality sleep. Patients will learn about a few of the most common sleep disorders. Patients will also be introduced to the concept of sleep hygiene, and discover ways to self-manage certain sleeping problems through simple daily behavior changes. Finally, the workshop will motivate patients by clarifying the links between quality sleep and their goals of heart-healthy living.   Recognizing and Reducing Stress Clinical staff led group instruction and group discussion with PowerPoint presentation and patient guidebook. To enhance the learning environment the use of posters, models and videos may be added. At the conclusion of this workshop, patients will be able to understand the types of stress reactions, differentiate between acute and chronic stress, and recognize the impact that chronic stress has on their health. They will also be able to apply different coping mechanisms, such as reframing negative self-talk. Patients will have the opportunity to practice a variety of stress management techniques, such as deep abdominal breathing, progressive muscle relaxation, and/or guided imagery.  The purpose of this lesson is to educate patients on the role of stress in their lives and to provide healthy techniques for coping with it.  Learning Barriers/Preferences:  Learning Barriers/Preferences - 03/21/24 1025       Learning Barriers/Preferences   Learning Barriers Sight    Learning Preferences Audio;Computer/Internet;Group Instruction;Individual Instruction;Pictoral;Skilled Demonstration;Verbal Instruction;Video;Written Material          Education Topics:  Knowledge Questionnaire Score:  Knowledge Questionnaire Score -  03/21/24 1027       Knowledge Questionnaire Score   Pre Score 23/24          Core Components/Risk Factors/Patient Goals at Admission:  Personal Goals and Risk Factors at Admission - 03/21/24 1028       Core Components/Risk Factors/Patient Goals on Admission    Weight Management Weight Maintenance    Improve shortness of breath with ADL's Yes    Intervention Provide education, individualized exercise plan and daily activity instruction to help decrease symptoms of SOB with activities of daily living.    Expected Outcomes Short Term: Improve cardiorespiratory fitness to achieve a reduction of symptoms when performing ADLs;Long Term: Be able to perform more ADLs without symptoms or delay the onset of symptoms    Increase knowledge of respiratory medications and ability to use respiratory devices properly  Yes    Intervention Provide education and demonstration as needed of appropriate use of medications, inhalers, and oxygen therapy.    Expected Outcomes Short Term: Achieves understanding of medications use. Understands that oxygen is a medication prescribed by physician. Demonstrates appropriate use of inhaler and oxygen therapy.;Long Term: Maintain appropriate use of medications, inhalers, and oxygen therapy.    Heart Failure Yes    Intervention Provide a combined exercise and nutrition program that is supplemented with education, support and counseling about heart failure. Directed toward relieving symptoms such as shortness of breath, decreased exercise tolerance, and extremity edema.  Expected Outcomes Improve functional capacity of life;Short term: Attendance in program 2-3 days a week with increased exercise capacity. Reported lower sodium intake. Reported increased fruit and vegetable intake. Reports medication compliance.;Short term: Daily weights obtained and reported for increase. Utilizing diuretic protocols set by physician.;Long term: Adoption of self-care skills and reduction of  barriers for early signs and symptoms recognition and intervention leading to self-care maintenance.    Hypertension Yes    Intervention Provide education on lifestyle modifcations including regular physical activity/exercise, weight management, moderate sodium restriction and increased consumption of fresh fruit, vegetables, and low fat dairy, alcohol moderation, and smoking cessation.;Monitor prescription use compliance.    Expected Outcomes Short Term: Continued assessment and intervention until BP is < 140/63mm HG in hypertensive participants. < 130/65mm HG in hypertensive participants with diabetes, heart failure or chronic kidney disease.;Long Term: Maintenance of blood pressure at goal levels.    Stress Yes    Intervention Offer individual and/or small group education and counseling on adjustment to heart disease, stress management and health-related lifestyle change. Teach and support self-help strategies.;Refer participants experiencing significant psychosocial distress to appropriate mental health specialists for further evaluation and treatment. When possible, include family members and significant others in education/counseling sessions.    Expected Outcomes Short Term: Participant demonstrates changes in health-related behavior, relaxation and other stress management skills, ability to obtain effective social support, and compliance with psychotropic medications if prescribed.;Long Term: Emotional wellbeing is indicated by absence of clinically significant psychosocial distress or social isolation.          Core Components/Risk Factors/Patient Goals Review:    Core Components/Risk Factors/Patient Goals at Discharge (Final Review):    ITP Comments:  ITP Comments     Row Name 03/21/24 0808           ITP Comments ITP Comments Wilbert Holland, MD: Medical Director. Introduction to the Pritikin Education Program/Intensive Cardiac Rehab. Intial orientation packet reviewed with the  patient.          Comments: Participant attended orientation for the cardiac rehabilitation program on  03/21/2024  to perform initial intake and exercise walk test. Patient introduced to the Pritikin Program education and orientation packet was reviewed. Completed 6-minute walk test, measurements, initial ITP, and exercise prescription. Vital signs stable. Telemetry-normal sinus rhythm, asymptomatic.   Service time was from 0803 to 0947.        [1]  Current Outpatient Medications:    anastrozole  (ARIMIDEX ) 1 MG tablet, Take 1 mg by mouth daily. , Disp: , Rfl:    BREYNA 160-4.5 MCG/ACT inhaler, Inhale 2 puffs into the lungs 2 (two) times daily., Disp: , Rfl:    cetirizine (ZYRTEC) 10 MG tablet, Take 10 mg by mouth daily as needed for allergies., Disp: , Rfl:    cholecalciferol  (VITAMIN D ) 1000 UNITS tablet, Take 5,000 Units by mouth daily., Disp: , Rfl:    empagliflozin  (JARDIANCE ) 10 MG TABS tablet, Take 1 tablet (10 mg total) by mouth daily before breakfast., Disp: 30 tablet, Rfl: 11   furosemide  (LASIX ) 20 MG tablet, Take 1 tablet (20 mg total) by mouth as needed., Disp: 30 tablet, Rfl: 1   LORazepam  (ATIVAN ) 0.5 MG tablet, Take 0.5 mg by mouth 3 (three) times daily as needed., Disp: , Rfl:    losartan  (COZAAR ) 25 MG tablet, Take 0.5 tablets (12.5 mg total) by mouth daily., Disp: 90 tablet, Rfl: 3   metoprolol  succinate (TOPROL  XL) 25 MG 24 hr tablet, Take 1 tablet (25 mg total) by mouth  at bedtime., Disp: 45 tablet, Rfl: 3   naproxen sodium (ALEVE) 220 MG tablet, Take 220 mg by mouth 2 (two) times daily as needed (pain)., Disp: , Rfl:    PROAIR  RESPICLICK 108 (90 Base) MCG/ACT AEPB, Take 2 puffs by mouth 4 (four) times daily as needed (sob/wheezing). , Disp: , Rfl:    spironolactone  (ALDACTONE ) 25 MG tablet, Take 1 tablet (25 mg total) by mouth daily., Disp: 90 tablet, Rfl: 3   thiamine  (VITAMIN B1) 100 MG tablet, Take 200 mg by mouth daily., Disp: , Rfl:  [2]  Social History Tobacco Use   Smoking Status Former  Smokeless Tobacco Never

## 2024-03-21 NOTE — Telephone Encounter (Signed)
 Test billing for this patient's current coverage ([employer]) returns a $30 copay for 90 day supply of Dapagliflozin, Sacubitril-Valsartan.  Wanda Dorsey returns $60 for 90 days supply.  This test claim was processed through [facility]- copay amounts may vary at other pharmacies due to pharmacy/plan contracts, or as the patient moves through the different stages of their insurance plan.  [name], CPhT Rx Patient Advocate Phone: [phone]

## 2024-03-21 NOTE — Progress Notes (Signed)
 Cardiac Rehab Medication Review by a Nurse  Does the patient  feel that his/her medications are working for him/her?  yes  Has the patient been experiencing any side effects to the medications prescribed?  no  Does the patient measure his/her own blood pressure or blood glucose at home?  no   Does the patient have any problems obtaining medications due to transportation or finances?   no  Understanding of regimen: excellent Understanding of indications: excellent Potential of compliance: excellent    Nurse comments: Wanda Dorsey is taking her medications as prescribed and has a good understanding of what her medications are for. Wanda Dorsey has a blood pressure cuff/ monitor. Wanda Dorsey does not check her blood pressure on a regular basis.    Wanda Gaw Bostyn Kunkler RN 03/21/2024 8:22 AM

## 2024-03-25 ENCOUNTER — Encounter (HOSPITAL_COMMUNITY)

## 2024-03-27 ENCOUNTER — Encounter (HOSPITAL_COMMUNITY)

## 2024-03-29 ENCOUNTER — Encounter (HOSPITAL_COMMUNITY)

## 2024-04-01 ENCOUNTER — Encounter (HOSPITAL_COMMUNITY)

## 2024-04-02 ENCOUNTER — Ambulatory Visit (HOSPITAL_COMMUNITY)

## 2024-04-03 ENCOUNTER — Encounter (HOSPITAL_COMMUNITY)

## 2024-04-05 ENCOUNTER — Encounter (HOSPITAL_COMMUNITY)

## 2024-04-08 ENCOUNTER — Encounter (HOSPITAL_COMMUNITY)

## 2024-04-10 ENCOUNTER — Encounter (HOSPITAL_COMMUNITY)

## 2024-04-12 ENCOUNTER — Encounter (HOSPITAL_COMMUNITY)

## 2024-04-15 ENCOUNTER — Encounter (HOSPITAL_COMMUNITY)

## 2024-04-17 ENCOUNTER — Encounter (HOSPITAL_COMMUNITY)

## 2024-04-19 ENCOUNTER — Encounter (HOSPITAL_COMMUNITY)

## 2024-04-22 ENCOUNTER — Encounter (HOSPITAL_COMMUNITY)

## 2024-04-24 ENCOUNTER — Encounter (HOSPITAL_COMMUNITY)

## 2024-04-26 ENCOUNTER — Encounter (HOSPITAL_COMMUNITY)

## 2024-04-29 ENCOUNTER — Encounter (HOSPITAL_COMMUNITY)

## 2024-05-01 ENCOUNTER — Encounter (HOSPITAL_COMMUNITY)

## 2024-05-03 ENCOUNTER — Encounter (HOSPITAL_COMMUNITY)

## 2024-05-06 ENCOUNTER — Encounter (HOSPITAL_COMMUNITY)

## 2024-05-08 ENCOUNTER — Encounter (HOSPITAL_COMMUNITY)

## 2024-05-10 ENCOUNTER — Encounter (HOSPITAL_COMMUNITY)

## 2024-05-13 ENCOUNTER — Encounter (HOSPITAL_COMMUNITY)

## 2024-05-15 ENCOUNTER — Encounter (HOSPITAL_COMMUNITY)

## 2024-05-17 ENCOUNTER — Encounter (HOSPITAL_COMMUNITY)

## 2024-05-20 ENCOUNTER — Encounter (HOSPITAL_COMMUNITY)

## 2024-05-22 ENCOUNTER — Encounter (HOSPITAL_COMMUNITY)

## 2024-05-24 ENCOUNTER — Encounter (HOSPITAL_COMMUNITY)

## 2024-05-27 ENCOUNTER — Encounter (HOSPITAL_COMMUNITY)

## 2024-05-29 ENCOUNTER — Encounter (HOSPITAL_COMMUNITY)

## 2024-05-31 ENCOUNTER — Encounter (HOSPITAL_COMMUNITY)

## 2024-06-03 ENCOUNTER — Encounter (HOSPITAL_COMMUNITY)

## 2024-06-05 ENCOUNTER — Other Ambulatory Visit (HOSPITAL_COMMUNITY)

## 2024-06-05 ENCOUNTER — Ambulatory Visit (HOSPITAL_COMMUNITY): Admitting: Cardiology

## 2024-06-05 ENCOUNTER — Encounter (HOSPITAL_COMMUNITY)

## 2024-06-07 ENCOUNTER — Encounter (HOSPITAL_COMMUNITY)

## 2024-06-10 ENCOUNTER — Encounter (HOSPITAL_COMMUNITY)

## 2024-06-12 ENCOUNTER — Encounter (HOSPITAL_COMMUNITY)
